# Patient Record
Sex: Male | Born: 1960
Health system: Southern US, Community
[De-identification: ages and names within clinical notes are randomized; demographics above are authoritative.]

## PROBLEM LIST (undated history)

## (undated) DIAGNOSIS — J309 Allergic rhinitis, unspecified: Secondary | ICD-10-CM

## (undated) DIAGNOSIS — M1990 Unspecified osteoarthritis, unspecified site: Secondary | ICD-10-CM

## (undated) DIAGNOSIS — N200 Calculus of kidney: Secondary | ICD-10-CM

## (undated) DIAGNOSIS — G8929 Other chronic pain: Secondary | ICD-10-CM

## (undated) DIAGNOSIS — K760 Fatty (change of) liver, not elsewhere classified: Secondary | ICD-10-CM

## (undated) DIAGNOSIS — Z91018 Allergy to other foods: Secondary | ICD-10-CM

## (undated) DIAGNOSIS — E7800 Pure hypercholesterolemia, unspecified: Secondary | ICD-10-CM

## (undated) HISTORY — DX: Other chronic pain: G89.29

## (undated) HISTORY — DX: Pure hypercholesterolemia, unspecified: E78.00

## (undated) HISTORY — DX: Allergic rhinitis, unspecified: J30.9

## (undated) HISTORY — DX: Fatty (change of) liver, not elsewhere classified: K76.0

## (undated) HISTORY — PX: TONSILLECTOMY AND ADENOIDECTOMY: SHX28

## (undated) HISTORY — DX: Calculus of kidney: N20.0

## (undated) HISTORY — DX: Allergy to other foods: Z91.018

---

## 2013-10-06 HISTORY — PX: SHOULDER SURGERY: SHX246

## 2015-10-07 HISTORY — PX: COLONOSCOPY: SHX174

## 2017-10-06 HISTORY — PX: COLONOSCOPY: SHX174

## 2018-02-02 ENCOUNTER — Encounter (HOSPITAL_COMMUNITY): Payer: Self-pay

## 2018-02-02 ENCOUNTER — Emergency Department (HOSPITAL_COMMUNITY)
Admission: EM | Admit: 2018-02-02 | Discharge: 2018-02-02 | Disposition: A | Discharge status: Home / Self Care | Payer: 59 | Attending: Emergency Medicine | Admitting: Emergency Medicine

## 2018-02-02 ENCOUNTER — Emergency Department (HOSPITAL_COMMUNITY): Payer: 59

## 2018-02-02 DIAGNOSIS — R109 Unspecified abdominal pain: Secondary | ICD-10-CM | POA: Diagnosis not present

## 2018-02-02 DIAGNOSIS — K5900 Constipation, unspecified: Secondary | ICD-10-CM | POA: Diagnosis not present

## 2018-02-02 DIAGNOSIS — N200 Calculus of kidney: Secondary | ICD-10-CM | POA: Diagnosis not present

## 2018-02-02 DIAGNOSIS — R1012 Left upper quadrant pain: Secondary | ICD-10-CM | POA: Diagnosis not present

## 2018-02-02 LAB — CBC WITH DIFFERENTIAL/PLATELET
BASOS ABS: 0 10*3/uL (ref 0.0–0.1)
Basophils Relative: 0 %
EOS ABS: 0.1 10*3/uL (ref 0.0–0.7)
Eosinophils Relative: 1 %
HEMATOCRIT: 46.2 % (ref 39.0–52.0)
HEMOGLOBIN: 15.3 g/dL (ref 13.0–17.0)
LYMPHS ABS: 1.7 10*3/uL (ref 0.7–4.0)
Lymphocytes Relative: 19 %
MCH: 29.1 pg (ref 26.0–34.0)
MCHC: 33.1 g/dL (ref 30.0–36.0)
MCV: 88 fL (ref 78.0–100.0)
MONOS PCT: 7 %
Monocytes Absolute: 0.6 10*3/uL (ref 0.1–1.0)
Neutro Abs: 6.9 10*3/uL (ref 1.7–7.7)
Neutrophils Relative %: 73 %
Platelets: 229 10*3/uL (ref 150–400)
RBC: 5.25 MIL/uL (ref 4.22–5.81)
RDW: 13.2 % (ref 11.5–15.5)
WBC: 9.3 10*3/uL (ref 4.0–10.5)

## 2018-02-02 LAB — URINALYSIS, ROUTINE W REFLEX MICROSCOPIC
Bilirubin Urine: NEGATIVE
GLUCOSE, UA: NEGATIVE mg/dL
Ketones, ur: NEGATIVE mg/dL
Leukocytes, UA: NEGATIVE
Nitrite: NEGATIVE
PH: 5 (ref 5.0–8.0)
Protein, ur: 100 mg/dL — AB
Specific Gravity, Urine: 1.03 (ref 1.005–1.030)

## 2018-02-02 LAB — COMPREHENSIVE METABOLIC PANEL
ALBUMIN: 4.5 g/dL (ref 3.5–5.0)
ALK PHOS: 49 U/L (ref 38–126)
ALT: 44 U/L (ref 17–63)
AST: 34 U/L (ref 15–41)
Anion gap: 10 (ref 5–15)
BILIRUBIN TOTAL: 1.2 mg/dL (ref 0.3–1.2)
BUN: 20 mg/dL (ref 6–20)
CALCIUM: 9.7 mg/dL (ref 8.9–10.3)
CO2: 23 mmol/L (ref 22–32)
CREATININE: 1.28 mg/dL — AB (ref 0.61–1.24)
Chloride: 106 mmol/L (ref 101–111)
GFR calc Af Amer: >60 mL/min (ref >60)
GLUCOSE: 121 mg/dL — AB (ref 65–99)
Potassium: 4.7 mmol/L (ref 3.5–5.1)
Sodium: 139 mmol/L (ref 135–145)
TOTAL PROTEIN: 7.6 g/dL (ref 6.5–8.1)

## 2018-02-02 LAB — LIPASE, BLOOD: LIPASE: 31 U/L (ref 11–51)

## 2018-02-02 MED ORDER — ONDANSETRON HCL 4 MG PO TABS: 4.0000 mg | ORAL_TABLET | Freq: Four times a day (QID) | ORAL | 0 refills | Status: DC | PRN

## 2018-02-02 MED ORDER — OXYCODONE-ACETAMINOPHEN 5-325 MG PO TABS: 1.0000 | ORAL_TABLET | Freq: Three times a day (TID) | ORAL | 0 refills | Status: DC | PRN

## 2018-02-02 MED ORDER — PROMETHAZINE HCL 25 MG/ML IJ SOLN
12.5000 mg | Freq: Once | INTRAMUSCULAR | Status: AC
  Administered 2018-02-02: 12.5 mg via INTRAVENOUS
  Filled 2018-02-02: qty 1

## 2018-02-02 MED ORDER — MORPHINE SULFATE (PF) 4 MG/ML IV SOLN
4.0000 mg | Freq: Once | INTRAVENOUS | Status: AC
  Administered 2018-02-02: 4 mg via INTRAVENOUS
  Filled 2018-02-02: qty 1

## 2018-02-02 MED ORDER — TAMSULOSIN HCL 0.4 MG PO CAPS: 0.4000 mg | ORAL_CAPSULE | Freq: Every day | ORAL | 0 refills | Status: DC

## 2018-02-02 MED ORDER — SODIUM CHLORIDE 0.9 % IV BOLUS
500.0000 mL | Freq: Once | INTRAVENOUS | Status: AC
Start: 2018-02-02 — End: 2018-02-02
  Administered 2018-02-02: 500 mL via INTRAVENOUS

## 2018-02-02 MED ORDER — KETOROLAC TROMETHAMINE 15 MG/ML IJ SOLN
15.0000 mg | Freq: Once | INTRAMUSCULAR | Status: AC
  Administered 2018-02-02: 15 mg via INTRAVENOUS
  Filled 2018-02-02: qty 1

## 2018-02-02 MED ORDER — ONDANSETRON HCL 4 MG/2ML IJ SOLN
4.0000 mg | Freq: Once | INTRAMUSCULAR | Status: AC
  Administered 2018-02-02: 4 mg via INTRAVENOUS
  Filled 2018-02-02: qty 2

## 2018-02-02 MED ORDER — ACETAMINOPHEN 325 MG PO TABS: 650.0000 mg | ORAL_TABLET | Freq: Once | ORAL | Status: DC

## 2018-02-02 MED ORDER — ONDANSETRON HCL 4 MG/2ML IJ SOLN
4.0000 mg | Freq: Once | INTRAMUSCULAR | Status: AC
Start: 2018-02-02 — End: 2018-02-02
  Administered 2018-02-02: 4 mg via INTRAVENOUS
  Filled 2018-02-02: qty 2

## 2018-02-02 MED ORDER — ACETAMINOPHEN 500 MG PO TABS: 500.0000 mg | ORAL_TABLET | Freq: Four times a day (QID) | ORAL | 0 refills | Status: DC | PRN

## 2018-02-02 NOTE — Discharge Instructions (Signed)
You will be given a prescription for Flomax, pain medication, and nausea medication.  You should not drive, work, or operate machinery while taking the pain medication as it can make you very drowsy.  You may take 500mg Tylenol every 6 hours and take one percocet every 8 hours for breakthrough pain. Make sure you stay well hydrated. You will need to follow-up with urology for reevaluation and for further treatment of your kidney stone.  You will need to return to the emergency department for any fevers, persistent pain, persistent vomiting, inability to urinate, or any new or worsening symptoms. ° °

## 2018-02-02 NOTE — ED Notes (Signed)
Report given to RN

## 2018-02-02 NOTE — ED Provider Notes (Signed)
[facility]-EMERGENCY DEPT Provider Note   CSN: [id number] Arrival date & time: 02/02/18  0532     History   Chief Complaint Chief Complaint  Patient presents with  . Flank Pain    HPI Earl Chambers is a 57 y.o. male.  HPI   Patient is a 57 year old male who presents the ED today complaining of left upper quadrant abdominal pain that radiates to the left lower quadrant that began this morning around 3 AM.  Pain woke him up from sleep suddenly.  It has been constant in nature and is severe.  He reports associated nausea and vomiting.  States he vomited 3 times.  No hematemesis.  Denies flank pain or fevers.  Reports that he has had intermittent issues with constipation over the last 2 weeks but his last bowel movement was yesterday and was normal without blood.  Denies any urinary frequency, dysuria, urgency.  Does note that his urine was somewhat darker than normal this morning.  History reviewed. No pertinent past medical history.  There are no active problems to display for this patient.   History reviewed. No pertinent surgical history.      Home Medications    Prior to Admission medications   Medication Sig Start Date End Date Taking? Authorizing Provider  acetaminophen (TYLENOL) 500 MG tablet Take 1 tablet (500 mg total) by mouth every 6 (six) hours as needed. 02/02/18   Andriana Casa S, PA-C  ondansetron (ZOFRAN) 4 MG tablet Take 1 tablet (4 mg total) by mouth every 6 (six) hours as needed for nausea or vomiting. 02/02/18   Khaled Herda S, PA-C  oxyCODONE-acetaminophen (PERCOCET/ROXICET) 5-325 MG tablet Take 1 tablet by mouth every 8 (eight) hours as needed for severe pain. 02/02/18   Jeneal Vogl S, PA-C  tamsulosin (FLOMAX) 0.4 MG CAPS capsule Take 1 capsule (0.4 mg total) by mouth daily. 02/02/18   Karena Kinker S, PA-C    Family History History reviewed. No pertinent family history.  Social History Social History   Tobacco Use  .  Smoking status: Never Smoker  . Smokeless tobacco: Never Used  Substance Use Topics  . Alcohol use: Never    Frequency: Never  . Drug use: Never     Allergies   Patient has no known allergies.   Review of Systems Review of Systems  Constitutional: Negative for chills and fever.  HENT: Negative for ear pain and sore throat.   Eyes: Negative for visual disturbance.  Respiratory: Negative for shortness of breath.   Cardiovascular: Negative for chest pain.  Gastrointestinal: Positive for abdominal pain, constipation, nausea and vomiting. Negative for blood in stool and diarrhea.  Genitourinary: Negative for dysuria, flank pain, frequency, hematuria, penile pain, penile swelling, scrotal swelling, testicular pain and urgency.       Dark urine  Musculoskeletal: Negative for back pain.  Skin: Negative for rash.  Neurological: Negative for dizziness, syncope, light-headedness and headaches.  All other systems reviewed and are negative.    Physical Exam Updated Vital Signs BP (!) 175/96 (BP Location: Left Arm)   Pulse 65   Temp (!) 97.4 F (36.3 C) (Oral)   Resp 18   SpO2 100%   Physical Exam  Constitutional: He appears well-developed and well-nourished.  Pt pacing the room, nontoxic appearing  HENT:  Head: Normocephalic and atraumatic.  Mouth/Throat: Oropharynx is clear and moist.  Eyes: Conjunctivae are normal. No scleral icterus.  Neck: Neck supple.  Cardiovascular: Normal rate, regular rhythm and normal  heart sounds.  No murmur heard. Pulmonary/Chest: Effort normal and breath sounds normal. No respiratory distress. He has no wheezes.  Abdominal: Soft. Bowel sounds are normal. He exhibits no distension. There is no tenderness. There is no guarding.  No CVA ttp  Musculoskeletal: He exhibits no edema.  Neurological: He is alert.  Skin: Skin is warm and dry. Capillary refill takes less than 2 seconds.  Psychiatric: He has a normal mood and affect.  Nursing note and  vitals reviewed.    ED Treatments / Results  Labs (all labs ordered are listed, but only abnormal results are displayed) Labs Reviewed  COMPREHENSIVE METABOLIC PANEL - Abnormal; Notable for the following components:      Result Value   Glucose, Bld 121 (*)    Creatinine, Ser 1.28 (*)    All other components within normal limits  URINALYSIS, ROUTINE W REFLEX MICROSCOPIC - Abnormal; Notable for the following components:   APPearance HAZY (*)    Hgb urine dipstick SMALL (*)    Protein, ur 100 (*)    Bacteria, UA RARE (*)    All other components within normal limits  CBC WITH DIFFERENTIAL/PLATELET  LIPASE, BLOOD    EKG None  Radiology Ct Renal Stone Study  Result Date: 02/02/2018 CLINICAL DATA:  Left flank pain EXAM: CT ABDOMEN AND PELVIS WITHOUT CONTRAST TECHNIQUE: Multidetector CT imaging of the abdomen and pelvis was performed following the standard protocol without IV contrast. COMPARISON:  None. FINDINGS: Lower chest: Lung bases are clear. No effusions. Heart is normal size. Hepatobiliary: Diffuse low-density throughout the liver compatible with fatty infiltration. No focal abnormality. Gallbladder unremarkable. Pancreas: No focal abnormality or ductal dilatation. Spleen: No focal abnormality.  Normal size. Adrenals/Urinary Tract: Moderate left hydronephrosis and perinephric stranding due to a punctate 2 mm left UVJ stone. No stones or hydronephrosis on the right. Adrenal glands and urinary bladder unremarkable. Stomach/Bowel: Normal appendix. Stomach, large and small bowel grossly unremarkable. Vascular/Lymphatic: Aortic and iliac calcifications scattered. No adenopathy. Reproductive: No visible focal abnormality. Other: No free fluid or free air. Bilateral inguinal hernias containing fat. Musculoskeletal: No acute bony abnormality. Degenerative facet disease in the lower lumbar spine. IMPRESSION: 2 mm left UVJ stone with moderate left hydronephrosis and perinephric stranding. Fatty  infiltration of the liver. Scattered aortoiliac atherosclerosis. Small bilateral inguinal hernias containing fat. Electronically Signed   By: Charlett Nose M.D.   On: 02/02/2018 09:54    Procedures Procedures (including critical care time)  Medications Ordered in ED Medications  ondansetron (ZOFRAN) injection 4 mg (4 mg Intravenous Given 02/02/18 0751)  sodium chloride 0.9 % bolus 500 mL (0 mLs Intravenous Stopped 02/02/18 0826)  morphine 4 MG/ML injection 4 mg (4 mg Intravenous Given 02/02/18 0820)  ondansetron (ZOFRAN) injection 4 mg (4 mg Intravenous Given 02/02/18 0820)  ketorolac (TORADOL) 15 MG/ML injection 15 mg (15 mg Intravenous Given 02/02/18 0847)  promethazine (PHENERGAN) injection 12.5 mg (12.5 mg Intravenous Given 02/02/18 1045)  morphine 4 MG/ML injection 4 mg (4 mg Intravenous Given 02/02/18 1045)     Initial Impression / Assessment and Plan / ED Course  I have reviewed the triage vital signs and the nursing notes.  Pertinent labs & imaging results that were available during my care of the patient were reviewed by me and considered in my medical decision making (see chart for details).   8:40 AM Re-evaluated pt. He is still c/o pain after morphine. Repeat abd exam with LUQ ttp. Will order toradol.  Reevaluated patient.  He  states he is feeling much better after Phenergan and another dose of morphine.  He has been able to tolerate p.o.  He has had no further episodes of vomiting.  Final Clinical Impressions(s) / ED Diagnoses   Final diagnoses:  Kidney stone   Pt has been diagnosed with a Kidney Stone via CT. There is no evidence of significant hydronephrosis, serum creatine WNL, vitals sign stable and the pt does not have irratractable vomiting. Pt will be dc home with pain medications, Zofran, Flomax.  He was given referral to urology.  He was advised to return to the ER immediately for any persistent vomiting, fevers, worsening abdominal pain, inability to urinate, or any new  or worsening symptoms.  All questions were answered and patient and wife at bedside understand plan and reasons to immediately to the ED.  ED Discharge Orders        Ordered    acetaminophen (TYLENOL) 500 MG tablet  Every 6 hours PRN     02/02/18 1212    oxyCODONE-acetaminophen (PERCOCET/ROXICET) 5-325 MG tablet  Every 8 hours PRN     02/02/18 1212    ondansetron (ZOFRAN) 4 MG tablet  Every 6 hours PRN     02/02/18 1212    tamsulosin (FLOMAX) 0.4 MG CAPS capsule  Daily     02/02/18 [address] [place], PA-C 02/02/18 1802    Shon Baton, MD 02/02/18 2259

## 2018-02-02 NOTE — ED Triage Notes (Signed)
Pt complains of left flank pain since 3am, pt doesn't have any urinary complaints

## 2018-03-29 ENCOUNTER — Encounter (HOSPITAL_COMMUNITY): Payer: Self-pay | Admitting: Emergency Medicine

## 2018-03-29 ENCOUNTER — Emergency Department (HOSPITAL_COMMUNITY)
Admission: EM | Admit: 2018-03-29 | Discharge: 2018-03-29 | Disposition: A | Discharge status: Home / Self Care | Payer: No Typology Code available for payment source | Attending: Emergency Medicine | Admitting: Emergency Medicine

## 2018-03-29 ENCOUNTER — Other Ambulatory Visit: Payer: Self-pay

## 2018-03-29 DIAGNOSIS — Z23 Encounter for immunization: Secondary | ICD-10-CM | POA: Diagnosis not present

## 2018-03-29 DIAGNOSIS — Y9389 Activity, other specified: Secondary | ICD-10-CM | POA: Insufficient documentation

## 2018-03-29 DIAGNOSIS — Y990 Civilian activity done for income or pay: Secondary | ICD-10-CM | POA: Diagnosis not present

## 2018-03-29 DIAGNOSIS — W01190A Fall on same level from slipping, tripping and stumbling with subsequent striking against furniture, initial encounter: Secondary | ICD-10-CM | POA: Diagnosis not present

## 2018-03-29 DIAGNOSIS — Y9289 Other specified places as the place of occurrence of the external cause: Secondary | ICD-10-CM | POA: Insufficient documentation

## 2018-03-29 DIAGNOSIS — S0101XA Laceration without foreign body of scalp, initial encounter: Secondary | ICD-10-CM | POA: Insufficient documentation

## 2018-03-29 DIAGNOSIS — S0102XA Laceration with foreign body of scalp, initial encounter: Secondary | ICD-10-CM | POA: Diagnosis not present

## 2018-03-29 MED ORDER — TETANUS-DIPHTH-ACELL PERTUSSIS 5-2.5-18.5 LF-MCG/0.5 IM SUSP
0.5000 mL | Freq: Once | INTRAMUSCULAR | Status: AC
  Administered 2018-03-29: 0.5 mL via INTRAMUSCULAR
  Filled 2018-03-29: qty 0.5

## 2018-03-29 MED ORDER — LIDOCAINE HCL 2 % IJ SOLN
20.0000 mL | Freq: Once | INTRAMUSCULAR | Status: AC
  Administered 2018-03-29: 400 mg
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
## 2018-03-29 NOTE — Discharge Instructions (Signed)
Please read attached information. If you experience any new or worsening signs or symptoms please return to the emergency room for evaluation. Please follow-up with your primary care provider or specialist as discussed.  °

## 2018-03-29 NOTE — ED Notes (Signed)
Pt verbalized understanding discharge instructions and denies any further needs or questions at this time. VS stable, ambulatory and steady gait.   
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
## 2018-08-19 DIAGNOSIS — R221 Localized swelling, mass and lump, neck: Secondary | ICD-10-CM | POA: Diagnosis not present

## 2018-08-30 ENCOUNTER — Ambulatory Visit (INDEPENDENT_AMBULATORY_CARE_PROVIDER_SITE_OTHER): Payer: 59 | Admitting: Family Medicine

## 2018-08-30 ENCOUNTER — Encounter: Payer: Self-pay | Admitting: Family Medicine

## 2018-08-30 VITALS — BP 120/71 | HR 69 | Temp 98.2°F | Resp 16 | Ht 68.0 in | Wt 194.0 lb

## 2018-08-30 DIAGNOSIS — E663 Overweight: Secondary | ICD-10-CM | POA: Diagnosis not present

## 2018-08-30 DIAGNOSIS — Z0000 Encounter for general adult medical examination without abnormal findings: Secondary | ICD-10-CM

## 2018-08-30 DIAGNOSIS — Z23 Encounter for immunization: Secondary | ICD-10-CM | POA: Diagnosis not present

## 2018-08-30 DIAGNOSIS — R5383 Other fatigue: Secondary | ICD-10-CM

## 2018-08-30 DIAGNOSIS — M542 Cervicalgia: Secondary | ICD-10-CM

## 2018-08-30 NOTE — Progress Notes (Signed)
Office Note 08/30/2018  CC:  Chief Complaint  Patient presents with  . Establish Care    Previous PCP: None  . Mass    back of neck    HPI:  Earl Chambers is a 57 y.o. male who is here to establish care. Patient's most recent primary MD: none Old records were not reviewed prior to or during today's visit.  Complains of fatigue: 2 months hx.  Has been on swing shifts x 2 yrs, but permanent night shifts the last 2 mo. Snores, but wife has not noted apneic spells in sleep.  Pt denies DOE, CP, dizziness, PND, orthopnea, cough, hemoptysis, or heat/cold intolerance.   No hx of fatigue in past other than this.  Denies sadness.  He says he is negative a lot, irritable, +anhedonia. Has short attention span more lately.  Can't sit down and focus on reading a book.   No problem with constant worrying. No melena.  No gross hematuria or BRBPR.  Appetite up and down. Gets 6 hours sleep after getting home in mornings.  He notes an egg shaped area on back of neck, was larger 2 wks ago, now has gone down today for the most part. No pain in the area.  No drainage.  Past Medical History:  Diagnosis Date  . Allergic rhinitis   . Food allergy    pecans    Past Surgical History:  Procedure Laterality Date  . COLONOSCOPY  2017   normal.  Recall 5 yrs?? per pt.  Marland Kitchen SHOULDER SURGERY Left 2015   RC repair    Family History  Problem Relation Age of Onset  . Cancer Sister   . Heart attack Brother     Social History   Socioeconomic History  . Marital status: Married    Spouse name: Not on file  . Number of children: Not on file  . Years of education: Not on file  . Highest education level: Not on file  Occupational History  . Not on file  Social Needs  . Financial resource strain: Not on file  . Food insecurity:    Worry: Not on file    Inability: Not on file  . Transportation needs:    Medical: Not on file    Non-medical: Not on file  Tobacco Use  . Smoking status: Never  Smoker  . Smokeless tobacco: Never Used  Substance and Sexual Activity  . Alcohol use: Yes    Frequency: Never    Comment: occ  . Drug use: Never  . Sexual activity: Not on file  Lifestyle  . Physical activity:    Days per week: Not on file    Minutes per session: Not on file  . Stress: Not on file  Relationships  . Social connections:    Talks on phone: Not on file    Gets together: Not on file    Attends religious service: Not on file    Active member of club or organization: Not on file    Attends meetings of clubs or organizations: Not on file    Relationship status: Not on file  . Intimate partner violence:    Fear of current or ex partner: Not on file    Emotionally abused: Not on file    Physically abused: Not on file    Forced sexual activity: Not on file  Other Topics Concern  . Not on file  Social History Narrative   Married, 1 daughter.   [place] but originally from [place], [place]   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   No tob.   Alc: occ beer.    Outpatient Encounter Medications as of 08/30/2018  Medication Sig  . Ibuprofen (ADVIL PO) Take by mouth as needed.  . [DISCONTINUED] acetaminophen (TYLENOL) 500 MG tablet Take 1 tablet (500 mg total) by mouth every 6 (six) hours as needed. (Patient not taking: Reported on 08/30/2018)  . [DISCONTINUED] ondansetron (ZOFRAN) 4 MG tablet Take 1 tablet (4 mg total) by mouth every 6 (six) hours as needed for nausea or vomiting. (Patient not taking: Reported on 08/30/2018)  . [DISCONTINUED] oxyCODONE-acetaminophen (PERCOCET/ROXICET) 5-325 MG tablet Take 1 tablet by mouth every 8 (eight) hours as needed for severe pain. (Patient not taking: Reported on 08/30/2018)  . [DISCONTINUED] tamsulosin (FLOMAX) 0.4 MG CAPS capsule Take 1 capsule (0.4 mg total) by mouth daily. (Patient not taking: Reported on 08/30/2018)   No facility-administered encounter  medications on file as of 08/30/2018.     No Known Allergies  ROS Review of Systems  Constitutional: Positive for fatigue. Negative for fever and unexpected weight change.  HENT: Negative for congestion, rhinorrhea and sore throat.   Eyes: Negative for visual disturbance.  Respiratory: Negative for cough and shortness of breath.   Cardiovascular: Negative for chest pain and palpitations.  Gastrointestinal: Negative for abdominal pain and nausea.  Endocrine: Negative for cold intolerance, heat intolerance, polydipsia, polyphagia and polyuria.  Genitourinary: Negative for dysuria.  Musculoskeletal: Negative for back pain and joint swelling.  Skin: Negative for rash.  Allergic/Immunologic: Positive for environmental allergies and food allergies.  Neurological: Negative for weakness and headaches.  Hematological: Negative for adenopathy.     PE; Blood pressure 120/71, pulse 69, temperature 98.2 F (36.8 C), temperature source Oral, resp. rate 16, height 5\' 8"  (1.727 m), weight 194 lb (88 kg), SpO2 98 %. Body mass index is 29.5 kg/m.  Gen: Alert, well appearing.  Patient is oriented to person, place, time, and situation. AFFECT: pleasant, lucid thought and speech. ZOX:WRUEENT:Eyes: no injection, icteris, swelling, or exudate.  EOMI, PERRLA. Mouth: lips without lesion/swelling.  Oral mucosa pink and moist. Oropharynx without erythema, exudate, or swelling.  CV: RRR, no m/r/g.   LUNGS: CTA bilat, nonlabored resps, good aeration in all lung fields. ABD: soft, NT, ND, BS normal.  No hepatospenomegaly or mass.  No bruits. EXT: no clubbing or cyanosis.  no edema.  Musculoskeletal: no joint swelling, erythema, warmth, or tenderness.  ROM of all joints intact. Neck: no nodule or mass, no muscle tightness, no rash, no tenderness.  Pertinent labs:  No results found for: TSH Lab Results  Component Value Date   WBC 9.3 02/02/2018   HGB 15.3 02/02/2018   HCT 46.2 02/02/2018   MCV 88.0 02/02/2018    PLT 229 02/02/2018   Lab Results  Component Value Date   CREATININE 1.28 (H) 02/02/2018   BUN 20 02/02/2018   NA 139 02/02/2018   K 4.7 02/02/2018   CL 106 02/02/2018   CO2 23 02/02/2018   Lab Results  Component Value Date   ALT 44 02/02/2018   AST 34 02/02/2018   ALKPHOS 49 02/02/2018   BILITOT 1.2 02/02/2018   No results found for: CHOL No results found for: HDL No results found for: LDLCALC No results found for: TRIG No results found for: CHOLHDL No results found for: PSA  No results found for: HGBA1C   ASSESSMENT AND PLAN:  New pt: get colonoscopy report to clarify findings and recommended f/u.  1) Fatigue, subacute: unclear etiology. No clear depression, no clear symptoms to suggest OSA. No sx's of CV/Pulm problem. Will check CBC w/diff, TSH, and CMET. If all normal, watchful waiting approach is the plan.  2) posterior neck swelling: suspect muscle tightness due to prolonged positioning in abnormal posture at work. No abnormality today on exam.  3) PReventative health care: flu vaccine today.  An After Visit Summary was printed and given to the patient.  Return for as needed.  Signed:  Santiago Bumpers, MD           08/30/2018

## 2018-08-31 LAB — CBC WITH DIFFERENTIAL/PLATELET
BASOS PCT: 0.9 % (ref 0.0–3.0)
Basophils Absolute: 0.1 10*3/uL (ref 0.0–0.1)
EOS ABS: 0.1 10*3/uL (ref 0.0–0.7)
Eosinophils Relative: 1.6 % (ref 0.0–5.0)
HEMATOCRIT: 46.9 % (ref 39.0–52.0)
HEMOGLOBIN: 15.6 g/dL (ref 13.0–17.0)
LYMPHS PCT: 32.8 % (ref 12.0–46.0)
Lymphs Abs: 2.1 10*3/uL (ref 0.7–4.0)
MCHC: 33.4 g/dL (ref 30.0–36.0)
MCV: 86.9 fl (ref 78.0–100.0)
MONOS PCT: 10.7 % (ref 3.0–12.0)
Monocytes Absolute: 0.7 10*3/uL (ref 0.1–1.0)
Neutro Abs: 3.4 10*3/uL (ref 1.4–7.7)
Neutrophils Relative %: 54 % (ref 43.0–77.0)
Platelets: 228 10*3/uL (ref 150.0–400.0)
RBC: 5.4 Mil/uL (ref 4.22–5.81)
RDW: 12.9 % (ref 11.5–15.5)
WBC: 6.3 10*3/uL (ref 4.0–10.5)

## 2018-08-31 LAB — COMPREHENSIVE METABOLIC PANEL
ALBUMIN: 4.5 g/dL (ref 3.5–5.2)
ALK PHOS: 41 U/L (ref 39–117)
ALT: 30 U/L (ref 0–53)
AST: 19 U/L (ref 0–37)
BUN: 15 mg/dL (ref 6–23)
CO2: 25 meq/L (ref 19–32)
Calcium: 10.2 mg/dL (ref 8.4–10.5)
Chloride: 108 mEq/L (ref 96–112)
Creatinine, Ser: 0.98 mg/dL (ref 0.40–1.50)
GFR: 84.26 mL/min (ref >60.00)
GLUCOSE: 67 mg/dL — AB (ref 70–99)
POTASSIUM: 4.6 meq/L (ref 3.5–5.1)
SODIUM: 140 meq/L (ref 135–145)
TOTAL PROTEIN: 6.9 g/dL (ref 6.0–8.3)
Total Bilirubin: 0.7 mg/dL (ref 0.2–1.2)

## 2018-08-31 LAB — TSH: TSH: 2.05 u[IU]/mL (ref 0.35–4.50)

## 2018-09-16 DIAGNOSIS — H524 Presbyopia: Secondary | ICD-10-CM | POA: Diagnosis not present

## 2019-07-15 IMAGING — CT CT RENAL STONE PROTOCOL
2 of 4 series · 17 of 46 positions shown, 19 images · non-contrast
Comparison: None.

CLINICAL DATA: Left flank pain

EXAM:
CT ABDOMEN AND PELVIS WITHOUT CONTRAST
TECHNIQUE: Multidetector CT imaging of the abdomen and pelvis was performed
following the standard protocol without IV contrast.

[Series 2: axial st · axial · 0.78mm/px · z∈[-540,-136]mm · 14 of 93 slices shown, 16 images]
[im 6/93  soft-tissue]
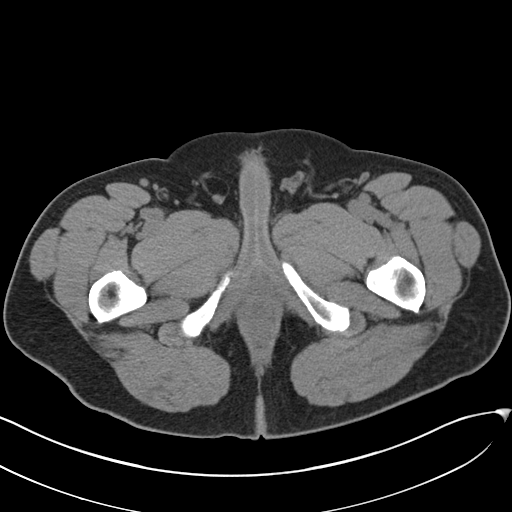
[im 6/93  bone]
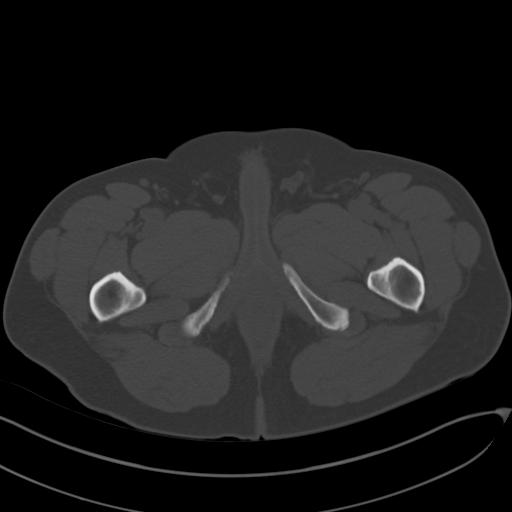
[im 12/93  soft-tissue]
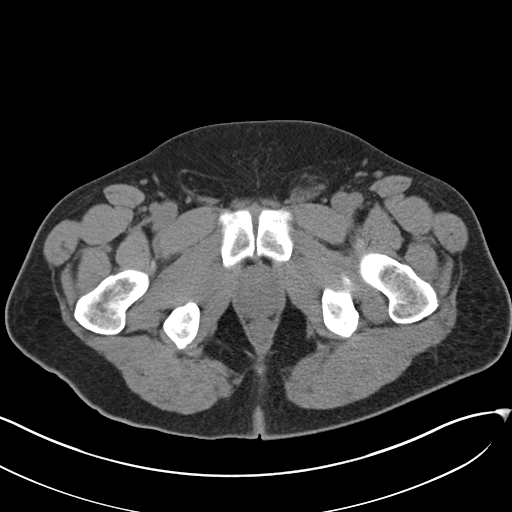
[im 18/93  soft-tissue]
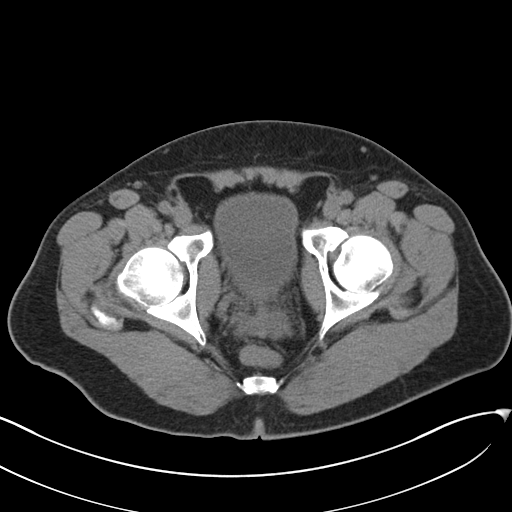
[im 24/93  soft-tissue]
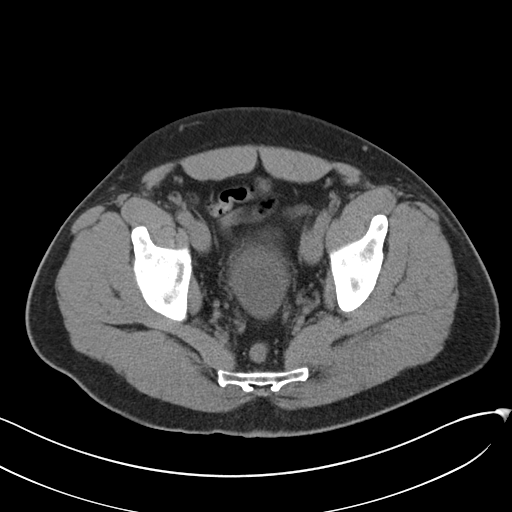
[im 29/93  soft-tissue]
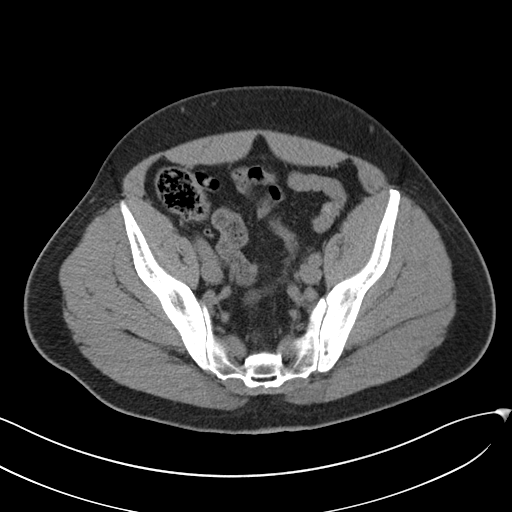
[im 35/93  soft-tissue]
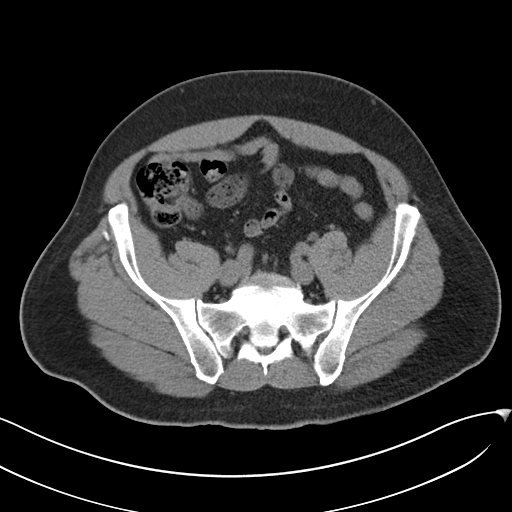
[im 41/93  soft-tissue]
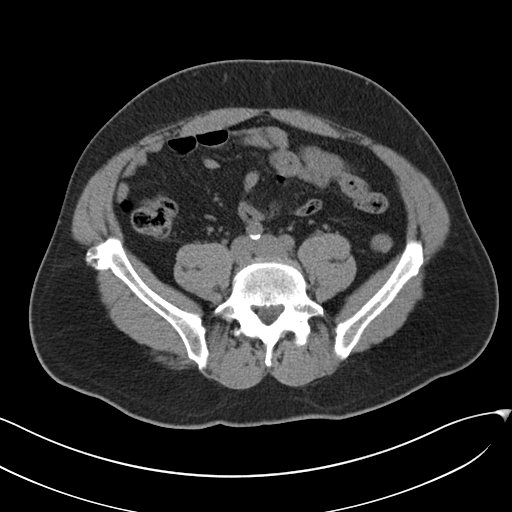
[im 52/93  soft-tissue]
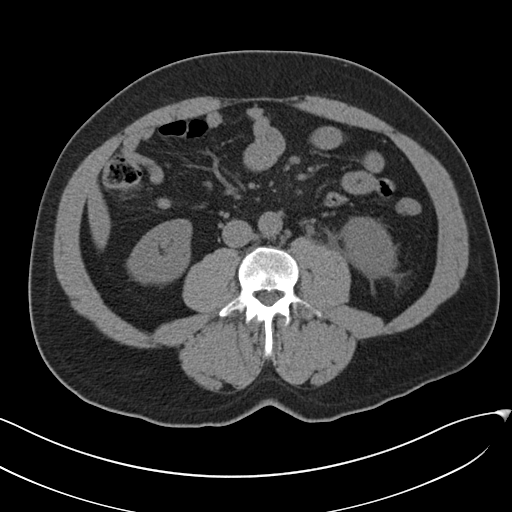
[im 58/93  soft-tissue]
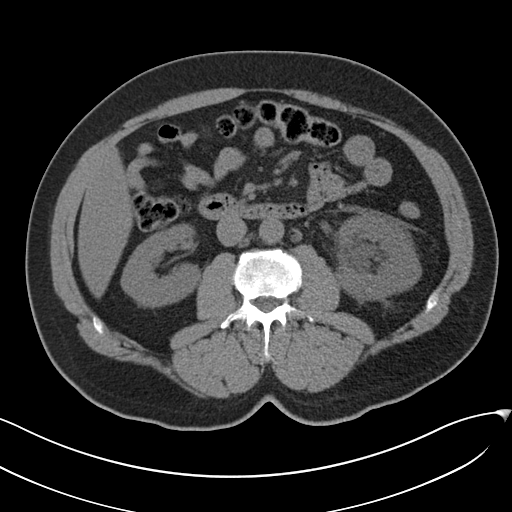
[im 58/93  bone]
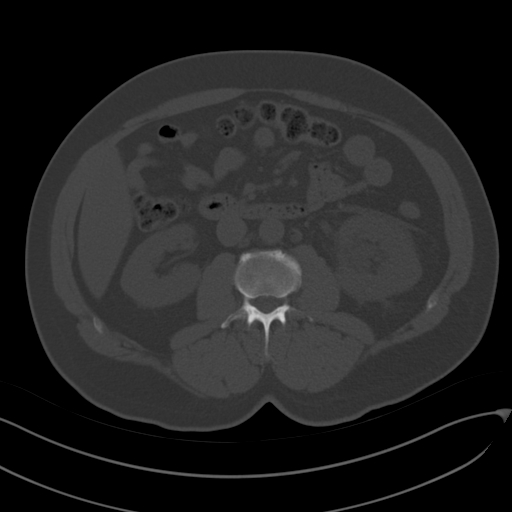
[im 64/93  soft-tissue]
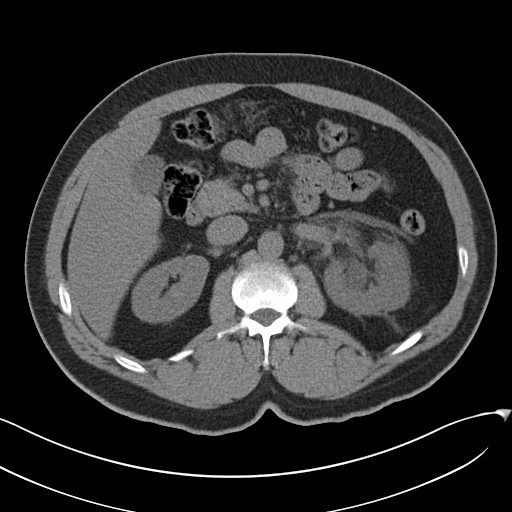
[im 70/93  soft-tissue]
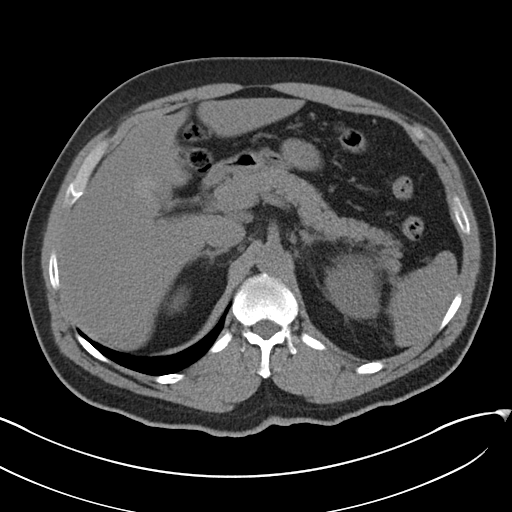
[im 75/93  soft-tissue]
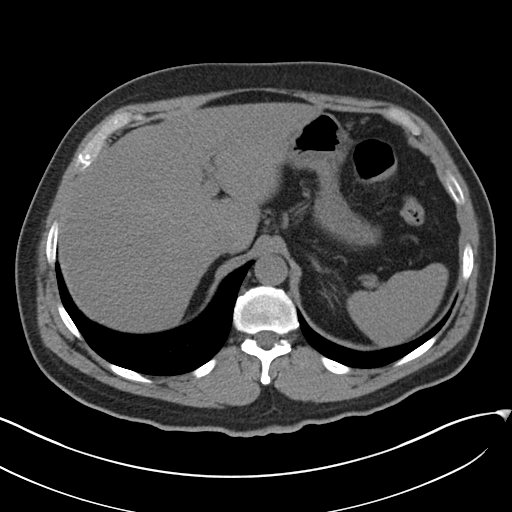
[im 81/93  soft-tissue]
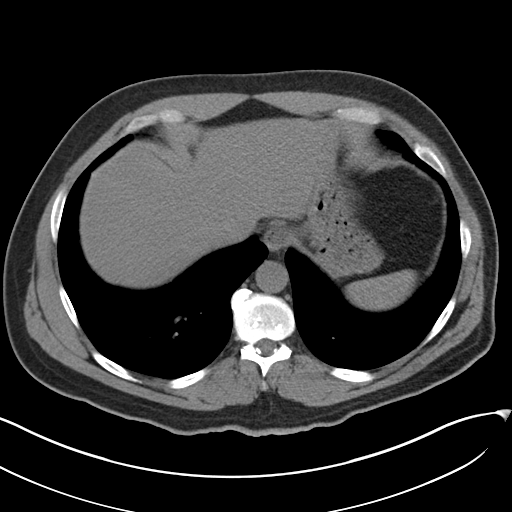
[im 87/93  soft-tissue]
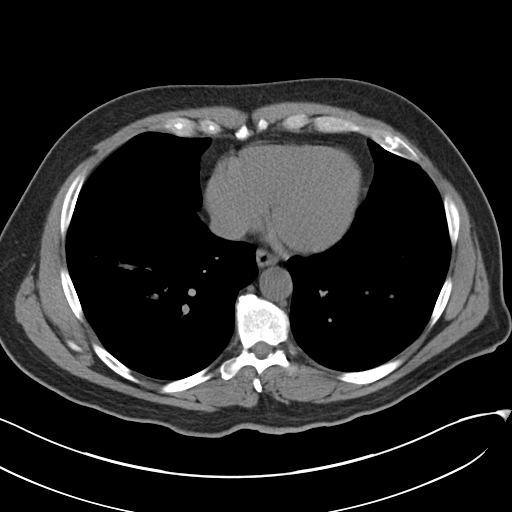

[Series 4: coronal · coronal · 0.80mm/px · 3 of 162 slices shown]
[im 54/162  soft-tissue]
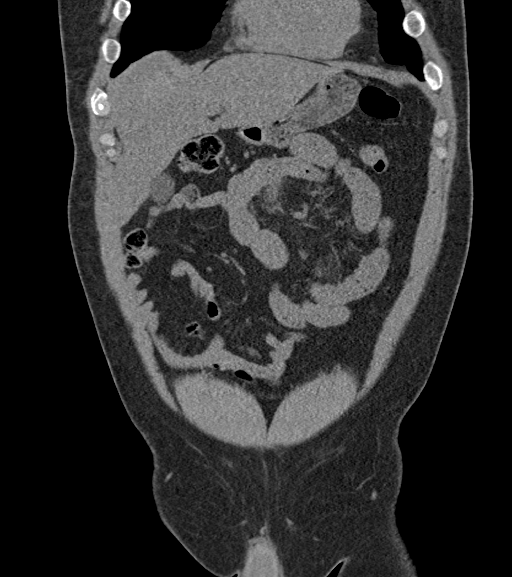
[im 72/162  soft-tissue]
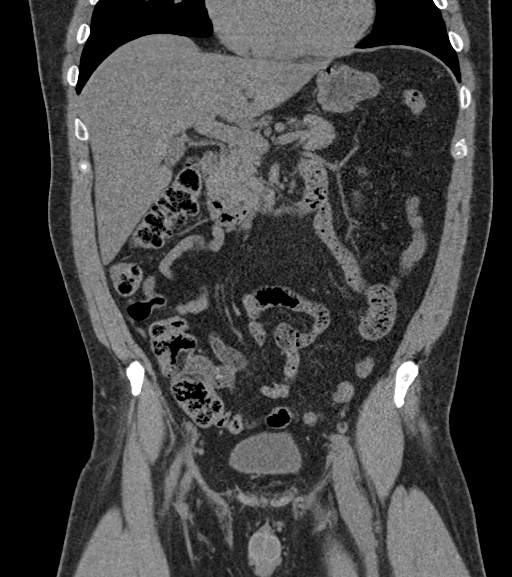
[im 90/162  soft-tissue]
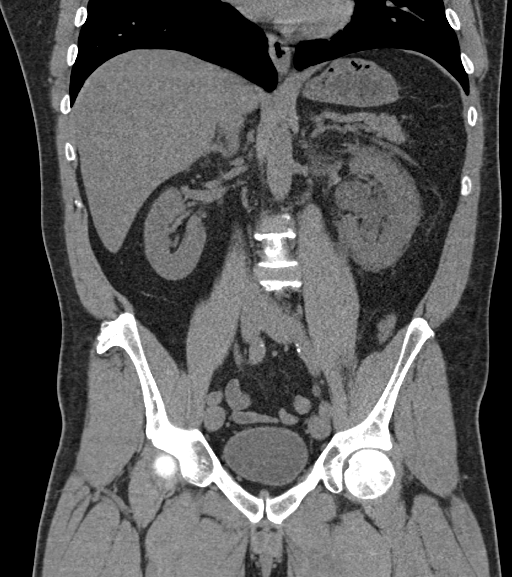

[17 of 46 positions shown; findings below may reference images not displayed]

FINDINGS: Lower chest: Lung bases are clear. No effusions. Heart is normal
size.

Hepatobiliary: Diffuse low-density throughout the liver compatible
with fatty infiltration. No focal abnormality. Gallbladder
unremarkable.

Pancreas: No focal abnormality or ductal dilatation.

Spleen: No focal abnormality.  Normal size.

Adrenals/Urinary Tract: Moderate left hydronephrosis and perinephric
stranding due to a punctate 2 mm left UVJ stone. No stones or
hydronephrosis on the right. Adrenal glands and urinary bladder
unremarkable.

Stomach/Bowel: Normal appendix. Stomach, large and small bowel
grossly unremarkable.

Vascular/Lymphatic: Aortic and iliac calcifications scattered. No
adenopathy.

Reproductive: No visible focal abnormality.

Other: No free fluid or free air. Bilateral inguinal hernias
containing fat.

Musculoskeletal: No acute bony abnormality. Degenerative facet
disease in the lower lumbar spine.
IMPRESSION: 2 mm left UVJ stone with moderate left hydronephrosis and
perinephric stranding.

Fatty infiltration of the liver.

Scattered aortoiliac atherosclerosis.

Small bilateral inguinal hernias containing fat.

## 2022-07-28 ENCOUNTER — Encounter: Payer: Self-pay | Admitting: Family Medicine

## 2022-07-31 ENCOUNTER — Ambulatory Visit: Payer: 59 | Admitting: Family Medicine

## 2022-07-31 ENCOUNTER — Encounter: Payer: Self-pay | Admitting: Family Medicine

## 2022-07-31 VITALS — BP 125/73 | HR 66 | Temp 98.7°F | Ht 68.0 in | Wt 193.4 lb

## 2022-07-31 DIAGNOSIS — Z125 Encounter for screening for malignant neoplasm of prostate: Secondary | ICD-10-CM | POA: Diagnosis not present

## 2022-07-31 DIAGNOSIS — M25511 Pain in right shoulder: Secondary | ICD-10-CM | POA: Diagnosis not present

## 2022-07-31 DIAGNOSIS — Z0000 Encounter for general adult medical examination without abnormal findings: Secondary | ICD-10-CM | POA: Diagnosis not present

## 2022-07-31 DIAGNOSIS — Z23 Encounter for immunization: Secondary | ICD-10-CM | POA: Diagnosis not present

## 2022-07-31 DIAGNOSIS — E7800 Pure hypercholesterolemia, unspecified: Secondary | ICD-10-CM

## 2022-07-31 DIAGNOSIS — Z3009 Encounter for other general counseling and advice on contraception: Secondary | ICD-10-CM | POA: Diagnosis not present

## 2022-07-31 DIAGNOSIS — G8929 Other chronic pain: Secondary | ICD-10-CM | POA: Diagnosis not present

## 2022-07-31 MED ORDER — IBUPROFEN 600 MG PO TABS: 600.0000 mg | ORAL_TABLET | Freq: Two times a day (BID) | ORAL | 1 refills | Status: DC | PRN

## 2022-07-31 MED ORDER — IBUPROFEN 800 MG PO TABS: 800.0000 mg | ORAL_TABLET | Freq: Two times a day (BID) | ORAL | 1 refills | Status: DC | PRN

## 2022-07-31 NOTE — Progress Notes (Signed)
Office Note 07/31/2022  CC:  Chief Complaint  Patient presents with   Establish Care   Shoulder Pain   Referral request    Pt would like referral for vasectomy.    HPI:  Earl Chambers is a 61 y.o. male who is here to reestablish care and get health maintenance exam. Patient's most recent primary MD: I saw him once back in April 2019. Old records were reviewed prior to or during today's visit.  #1 small lump on the scrotal area for the last few years.  About 1 to 2 cm in size per his estimate. Little uncomfortable but not painful.  Has not drained.  Has not turned red.  2.  At least 6 months of gradually progressive right shoulder pain, primarily with abduction and external rotation.  When trying to lift above his head hurts and he has to stop. No preceding injury/trauma. No neck pain. No arm paresthesias or weakness.  Past Medical History:  Diagnosis Date   Allergic rhinitis    Fatty liver    Noted on CT stone protocol 2019.   Food allergy    pecans   Nephrolithiasis    Left.  2019.    Past Surgical History:  Procedure Laterality Date   COLONOSCOPY  2019   normal.  Recall 10 yrs   SHOULDER SURGERY Left 2015   RC repair, distal clav excision   TONSILLECTOMY AND ADENOIDECTOMY      Family History  Problem Relation Age of Onset   Cancer Sister    Heart attack Brother     Social History   Socioeconomic History   Marital status: Married    Spouse name: Not on file   Number of children: Not on file   Years of education: Not on file   Highest education level: Not on file  Occupational History   Not on file  Tobacco Use   Smoking status: Never   Smokeless tobacco: Never  Vaping Use   Vaping Use: Never used  Substance and Sexual Activity   Alcohol use: Yes    Comment: occ   Drug use: Never   Sexual activity: Not on file  Other Topics Concern   Not on file  Social History Narrative   Married, 1 daughter.   Most recently relocated from [place],  [place] but originally from [place], [place].   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   No tob.   Alc: occ beer.   Social Determinants of Health   Financial Resource Strain: Not on file  Food Insecurity: Not on file  Transportation Needs: Not on file  Physical Activity: Not on file  Stress: Not on file  Social Connections: Not on file  Intimate Partner Violence: Not on file    Outpatient Encounter Medications as of 07/31/2022  Medication Sig   ibuprofen (ADVIL) 800 MG tablet Take 1 tablet (800 mg total) by mouth 2 (two) times daily as needed.   [DISCONTINUED] Ibuprofen (ADVIL PO) Take by mouth as needed.   [DISCONTINUED] ibuprofen (ADVIL) 600 MG tablet Take 1 tablet (600 mg total) by mouth 2 (two) times daily as needed for moderate pain.   fluorouracil (EFUDEX) 5 % cream Apply topically 2 (two) times daily. (Patient not taking: Reported on 07/31/2022)   triamcinolone cream (KENALOG) 0.1 % SMARTSIG:sparingly Topical Twice Daily (Patient not taking: Reported on 07/31/2022)   No facility-administered encounter medications on file as of 07/31/2022.    No Known Allergies  Review of  Systems  Constitutional:  Negative for appetite change, chills, fatigue and fever.  HENT:  Negative for congestion, dental problem, ear pain and sore throat.   Eyes:  Negative for discharge, redness and visual disturbance.  Respiratory:  Negative for cough, chest tightness, shortness of breath and wheezing.   Cardiovascular:  Negative for chest pain, palpitations and leg swelling.  Gastrointestinal:  Negative for abdominal pain, blood in stool, diarrhea, nausea and vomiting.  Genitourinary:  Negative for difficulty urinating, dysuria, flank pain, frequency, hematuria and urgency.  Musculoskeletal:  Positive for arthralgias (right shoulder). Negative for back pain, joint swelling, myalgias and neck stiffness.  Skin:  Negative for pallor and rash.  Neurological:  Negative for  dizziness, speech difficulty, weakness and headaches.  Hematological:  Negative for adenopathy. Does not bruise/bleed easily.  Psychiatric/Behavioral:  Negative for confusion and sleep disturbance. The patient is not nervous/anxious.     PE; Blood pressure 125/73, pulse 66, temperature 98.7 F (37.1 C), height $RemoveBe'5\' 8"'sJBrpFnZy$  (1.727 m), weight 193 lb 6.4 oz (87.7 kg), SpO2 95 %.Body mass index is 29.41 kg/m.   Physical Exam  Gen: Alert, well appearing.  Patient is oriented to person, place, time, and situation. AFFECT: pleasant, lucid thought and speech. ENT: Ears: EACs clear, normal epithelium.  TMs with good light reflex and landmarks bilaterally.  Eyes: no injection, icteris, swelling, or exudate.  EOMI, PERRLA. Nose: no drainage or turbinate edema/swelling.  No injection or focal lesion.  Mouth: lips without lesion/swelling.  Oral mucosa pink and moist.  Dentition intact and without obvious caries or gingival swelling.  Oropharynx without erythema, exudate, or swelling.  Neck: supple/nontender.  No LAD, mass, or TM.  Carotid pulses 2+ bilaterally, without bruits. CV: RRR, no m/r/g.   LUNGS: CTA bilat, nonlabored resps, good aeration in all lung fields. ABD: soft, NT, ND, BS normal.  No hepatospenomegaly or mass.  No bruits. EXT: no clubbing, cyanosis, or edema.  Skin - no sores or suspicious lesions or rashes or color changes Right shoulder: Tender to palpation over the [facility] joint.  Positive scarf sign. Positive Hawkins.  Neer's equivocal.  Speeds and Yergason's negative. Strength 5 out of 5 proximally and distally bilaterally. He did have some general subacromial discomfort with resisted external rotation and AB duction. Abduction limited to 100-110 deg  GU exam: Scrotum with approximately 2 cm oblong subcutaneous nodular lesion, firm and nontender.  No overlying erythema. No drainage tract.  Pertinent labs:  Lab Results  Component Value Date   TSH 2.05 08/30/2018   Lab Results   Component Value Date   WBC 6.3 08/30/2018   HGB 15.6 08/30/2018   HCT 46.9 08/30/2018   MCV 86.9 08/30/2018   PLT 228.0 08/30/2018   Lab Results  Component Value Date   CREATININE 0.98 08/30/2018   BUN 15 08/30/2018   NA 140 08/30/2018   K 4.6 08/30/2018   CL 108 08/30/2018   CO2 25 08/30/2018   Lab Results  Component Value Date   ALT 30 08/30/2018   AST 19 08/30/2018   ALKPHOS 41 08/30/2018   BILITOT 0.7 08/30/2018   ASSESSMENT AND PLAN:   New patient, reestablishing care.  #1 Health maintenance exam: Reviewed age and gender appropriate health maintenance issues (prudent diet, regular exercise, health risks of tobacco and excessive alcohol, use of seatbelts, fire alarms in home, use of sunscreen).  Also reviewed age and gender appropriate health screening as well as vaccine recommendations. Vaccines: Vaccine today.  He declined Shingrix--- wants to wait  until age 56.  Tdap up-to-date 2019. Labs: CBC, c-Met, and lipid panel ordered future when fasting. Prostate ca screening: PSA ordered Colon ca screening: Normal colonoscopy 2019.  Recall 10 years.  #2 subacute right shoulder pain. AC joint etiology suspected, question some RC impingement and tendinitis as well. Check right shoulder plain radiographs. Referral ordered for physical therapy. Ibuprofen 800 mg, 1 twice daily as needed. Discussed consideration of steroid injection if not significantly improving in the future.  #3 scrotal epidermal inclusion cyst. Reassured.  #4 vasectomy referral requested by patient today.  An After Visit Summary was printed and given to the patient.  Return in about 6 weeks (around 09/11/2022) for shoulder pain.  Signed:  Crissie Sickles, MD           07/31/2022

## 2022-07-31 NOTE — Patient Instructions (Signed)
Health Maintenance, Male Adopting a healthy lifestyle and getting preventive care are important in promoting health and wellness. Ask your health care provider about: The right schedule for you to have regular tests and exams. Things you can do on your own to prevent diseases and keep yourself healthy. What should I know about diet, weight, and exercise? Eat a healthy diet  Eat a diet that includes plenty of vegetables, fruits, low-fat dairy products, and lean protein. Do not eat a lot of foods that are high in solid fats, added sugars, or sodium. Maintain a healthy weight Body mass index (BMI) is a measurement that can be used to identify possible weight problems. It estimates body fat based on height and weight. Your health care provider can help determine your BMI and help you achieve or maintain a healthy weight. Get regular exercise Get regular exercise. This is one of the most important things you can do for your health. Most adults should: Exercise for at least 150 minutes each week. The exercise should increase your heart rate and make you sweat (moderate-intensity exercise). Do strengthening exercises at least twice a week. This is in addition to the moderate-intensity exercise. Spend less time sitting. Even light physical activity can be beneficial. Watch cholesterol and blood lipids Have your blood tested for lipids and cholesterol at 59 years of age, then have this test every 5 years. You may need to have your cholesterol levels checked more often if: Your lipid or cholesterol levels are high. You are older than 59 years of age. You are at high risk for heart disease. What should I know about cancer screening? Many types of cancers can be detected early and may often be prevented. Depending on your health history and family history, you may need to have cancer screening at various ages. This may include screening for: Colorectal cancer. Prostate cancer. Skin cancer. Lung  cancer. What should I know about heart disease, diabetes, and high blood pressure? Blood pressure and heart disease High blood pressure causes heart disease and increases the risk of stroke. This is more likely to develop in people who have high blood pressure readings or are overweight. Talk with your health care provider about your target blood pressure readings. Have your blood pressure checked: Every 3-5 years if you are 18-39 years of age. Every year if you are 40 years old or older. If you are between the ages of 65 and 75 and are a current or former smoker, ask your health care provider if you should have a one-time screening for abdominal aortic aneurysm (AAA). Diabetes Have regular diabetes screenings. This checks your fasting blood sugar level. Have the screening done: Once every three years after age 45 if you are at a normal weight and have a low risk for diabetes. More often and at a younger age if you are overweight or have a high risk for diabetes. What should I know about preventing infection? Hepatitis B If you have a higher risk for hepatitis B, you should be screened for this virus. Talk with your health care provider to find out if you are at risk for hepatitis B infection. Hepatitis C Blood testing is recommended for: Everyone born from 1945 through 1965. Anyone with known risk factors for hepatitis C. Sexually transmitted infections (STIs) You should be screened each year for STIs, including gonorrhea and chlamydia, if: You are sexually active and are younger than 59 years of age. You are older than 59 years of age and your   health care provider tells you that you are at risk for this type of infection. Your sexual activity has changed since you were last screened, and you are at increased risk for chlamydia or gonorrhea. Ask your health care provider if you are at risk. Ask your health care provider about whether you are at high risk for HIV. Your health care provider  may recommend a prescription medicine to help prevent HIV infection. If you choose to take medicine to prevent HIV, you should first get tested for HIV. You should then be tested every 3 months for as long as you are taking the medicine. Follow these instructions at home: Alcohol use Do not drink alcohol if your health care provider tells you not to drink. If you drink alcohol: Limit how much you have to 0-2 drinks a day. Know how much alcohol is in your drink. In the [place] one drink equals one 12 oz bottle of beer (355 mL), one 5 oz glass of wine (148 mL), or one 1 oz glass of hard liquor (44 mL). Lifestyle Do not use any products that contain nicotine or tobacco. These products include cigarettes, chewing tobacco, and vaping devices, such as e-cigarettes. If you need help quitting, ask your health care provider. Do not use street drugs. Do not share needles. Ask your health care provider for help if you need support or information about quitting drugs. General instructions Schedule regular health, dental, and eye exams. Stay current with your vaccines. Tell your health care provider if: You often feel depressed. You have ever been abused or do not feel safe at home. Summary Adopting a healthy lifestyle and getting preventive care are important in promoting health and wellness. Follow your health care provider's instructions about healthy diet, exercising, and getting tested or screened for diseases. Follow your health care provider's instructions on monitoring your cholesterol and blood pressure. This information is not intended to replace advice given to you by your health care provider. Make sure you discuss any questions you have with your health care provider. Document Revised: 02/11/2021 Document Reviewed: 02/11/2021 Elsevier Patient Education  2023 Elsevier Inc.  

## 2022-08-01 ENCOUNTER — Other Ambulatory Visit (INDEPENDENT_AMBULATORY_CARE_PROVIDER_SITE_OTHER): Payer: 59

## 2022-08-01 DIAGNOSIS — Z0000 Encounter for general adult medical examination without abnormal findings: Secondary | ICD-10-CM

## 2022-08-01 DIAGNOSIS — Z125 Encounter for screening for malignant neoplasm of prostate: Secondary | ICD-10-CM | POA: Diagnosis not present

## 2022-08-01 DIAGNOSIS — E7800 Pure hypercholesterolemia, unspecified: Secondary | ICD-10-CM

## 2022-08-01 LAB — CBC
HCT: 45.5 % (ref 39.0–52.0)
Hemoglobin: 14.7 g/dL (ref 13.0–17.0)
MCHC: 32.4 g/dL (ref 30.0–36.0)
MCV: 88.4 fl (ref 78.0–100.0)
Platelets: 189 10*3/uL (ref 150.0–400.0)
RBC: 5.15 Mil/uL (ref 4.22–5.81)
RDW: 13.5 % (ref 11.5–15.5)
WBC: 5.8 10*3/uL (ref 4.0–10.5)

## 2022-08-01 LAB — LIPID PANEL
Cholesterol: 169 mg/dL (ref 0–200)
HDL: 42.3 mg/dL (ref >39.00)
LDL Cholesterol: 104 mg/dL — ABNORMAL HIGH (ref 0–99)
NonHDL: 126.9
Total CHOL/HDL Ratio: 4
Triglycerides: 117 mg/dL (ref 0.0–149.0)
VLDL: 23.4 mg/dL (ref 0.0–40.0)

## 2022-08-01 LAB — COMPREHENSIVE METABOLIC PANEL
ALT: 22 U/L (ref 0–53)
AST: 19 U/L (ref 0–37)
Albumin: 4.2 g/dL (ref 3.5–5.2)
Alkaline Phosphatase: 42 U/L (ref 39–117)
BUN: 18 mg/dL (ref 6–23)
CO2: 29 mEq/L (ref 19–32)
Calcium: 9.5 mg/dL (ref 8.4–10.5)
Chloride: 107 mEq/L (ref 96–112)
Creatinine, Ser: 1.02 mg/dL (ref 0.40–1.50)
GFR: 80.53 mL/min (ref >60.00)
Glucose, Bld: 94 mg/dL (ref 70–99)
Potassium: 4.6 mEq/L (ref 3.5–5.1)
Sodium: 142 mEq/L (ref 135–145)
Total Bilirubin: 0.9 mg/dL (ref 0.2–1.2)
Total Protein: 6.4 g/dL (ref 6.0–8.3)

## 2022-08-01 LAB — PSA: PSA: 1.58 ng/mL (ref 0.10–4.00)

## 2022-08-08 ENCOUNTER — Telehealth: Payer: Self-pay

## 2022-08-08 NOTE — Telephone Encounter (Signed)
Signed and put in box to go up front. Signed:  Phil Puneet Masoner, MD           08/08/2022  

## 2022-08-08 NOTE — Telephone Encounter (Signed)
Received PT eval form. Placed on PCP desk for signature

## 2022-08-12 ENCOUNTER — Telehealth: Payer: Self-pay

## 2022-08-12 NOTE — Telephone Encounter (Signed)
A user error has taken place: encounter opened in error, closed for administrative reasons.

## 2022-09-10 ENCOUNTER — Ambulatory Visit: Payer: 59 | Admitting: Family Medicine

## 2022-09-11 ENCOUNTER — Ambulatory Visit: Payer: 59 | Admitting: Urology

## 2022-09-22 ENCOUNTER — Ambulatory Visit: Payer: 59 | Admitting: Family Medicine

## 2022-09-22 NOTE — Progress Notes (Deleted)
OFFICE VISIT  09/22/2022  CC: No chief complaint on file.  Patient is a 61 y.o. male who presents for 6-week follow-up right shoulder pain. A/P as of last visit: "subacute right shoulder pain. AC joint etiology suspected, question some RC impingement and tendinitis as well. Check right shoulder plain radiographs. Referral ordered for physical therapy. Ibuprofen 800 mg, 1 twice daily as needed. Discussed consideration of steroid injection if not significantly improving in the future."  INTERIM HX: All labs were normal last visit. It appears he did not get the x-ray ordered last visit. ***   Past Medical History:  Diagnosis Date   Allergic rhinitis    Fatty liver    Noted on CT stone protocol 2019.   Food allergy    pecans   Nephrolithiasis    Left.  2019.    Past Surgical History:  Procedure Laterality Date   COLONOSCOPY  2019   normal.  Recall 10 yrs   SHOULDER SURGERY Left 2015   RC repair, distal clav excision   TONSILLECTOMY AND ADENOIDECTOMY      Outpatient Medications Prior to Visit  Medication Sig Dispense Refill   fluorouracil (EFUDEX) 5 % cream Apply topically 2 (two) times daily. (Patient not taking: Reported on 07/31/2022)     ibuprofen (ADVIL) 800 MG tablet Take 1 tablet (800 mg total) by mouth 2 (two) times daily as needed. 60 tablet 1   triamcinolone cream (KENALOG) 0.1 % SMARTSIG:sparingly Topical Twice Daily (Patient not taking: Reported on 07/31/2022)     No facility-administered medications prior to visit.    No Known Allergies  ROS As per HPI  PE:    07/31/2022    1:19 PM 08/30/2018    2:34 PM 03/29/2018    7:30 PM  Vitals with BMI  Height 5\' 8"  5\' 8"    Weight 193 lbs 6 oz 194 lbs   BMI 29.41 29.5   Systolic 125 120  Diastolic 73 71 78  Pulse 66 69 66     Physical Exam  ***  LABS:  Last CBC Lab Results  Component Value Date   WBC 5.8 08/01/2022   HGB 14.7 08/01/2022   HCT 45.5 08/01/2022   MCV 88.4 08/01/2022    MCH 29.1 02/02/2018   RDW 13.5 08/01/2022   PLT 189.0 08/01/2022   Last metabolic panel Lab Results  Component Value Date   GLUCOSE 94 08/01/2022   NA 142 08/01/2022   K 4.6 08/01/2022   CL 107 08/01/2022   CO2 29 08/01/2022   BUN 18 08/01/2022   CREATININE 1.02 08/01/2022   GFRNONAA >60 02/02/2018   CALCIUM 9.5 08/01/2022   PROT 6.4 08/01/2022   ALBUMIN 4.2 08/01/2022   BILITOT 0.9 08/01/2022   ALKPHOS 42 08/01/2022   AST 19 08/01/2022   ALT 22 08/01/2022   ANIONGAP 10 02/02/2018   IMPRESSION AND PLAN:  No problem-specific Assessment & Plan notes found for this encounter.   An After Visit Summary was printed and given to the patient.  FOLLOW UP: No follow-ups on file.  Signed:  08/03/2022, MD           09/22/2022

## 2022-10-01 ENCOUNTER — Encounter: Payer: Self-pay | Admitting: Family Medicine

## 2022-10-08 ENCOUNTER — Ambulatory Visit: Payer: 59 | Admitting: Family Medicine

## 2022-10-08 ENCOUNTER — Encounter: Payer: Self-pay | Admitting: Family Medicine

## 2022-10-08 VITALS — BP 131/86 | HR 68 | Temp 98.5°F | Ht 68.0 in | Wt 200.8 lb

## 2022-10-08 DIAGNOSIS — M25511 Pain in right shoulder: Secondary | ICD-10-CM | POA: Diagnosis not present

## 2022-10-08 NOTE — Progress Notes (Signed)
OFFICE VISIT  10/08/2022  CC:  Chief Complaint  Patient presents with   Follow-up    Pt present to follow up on right shoulder pain. Pt states shoulder feels much better.    Patient is a 62 y.o. male who presents for follow-up right shoulder pain. I last saw him 07/31/2022. A/P as of that visit: "Subacute right shoulder pain. AC joint etiology suspected, question some RC impingement and tendinitis as well. Check right shoulder plain radiographs. Referral ordered for physical therapy. Ibuprofen 800 mg, 1 twice daily as needed. Discussed consideration of steroid injection if not significantly improving in the future."  INTERIM HX: Tavin did PT and he feels much much better. Not requiring any medicine for pain for his shoulder. He continues to do home exercises. He decided not to get the x-ray that I ordered last visit.  Since I last saw him he says he got COVID infection and then he got a sinus infection.  He has been on antibiotics for 10 days, finished 2 days ago (amoxil or augmentin--he does not recall which).  He says his left sided sinus pressure and nasal congestion and abnormal smell all got significantly better through the antibiotics course.  However the antibiotics did bother his stomach.  All of his symptoms returned this morning but have improved this afternoon with a dose of Mucinex. No fever.  He has some PND cough.  No wheezing or shortness of breath.  No sore throat.  He has not been exercising much due to feeling bad.  Says he has not been eating very well.  He has put on 10 pounds and is kind of frustrated.   Past Medical History:  Diagnosis Date   Allergic rhinitis    Fatty liver    Noted on CT stone protocol 2019.   Food allergy    pecans   Nephrolithiasis    Left.  2019.    Past Surgical History:  Procedure Laterality Date   COLONOSCOPY  2019   normal.  Recall 10 yrs   SHOULDER SURGERY Left 2015   RC repair, distal clav excision   TONSILLECTOMY AND  ADENOIDECTOMY      Outpatient Medications Prior to Visit  Medication Sig Dispense Refill   ibuprofen (ADVIL) 800 MG tablet Take 1 tablet (800 mg total) by mouth 2 (two) times daily as needed. 60 tablet 1   fluorouracil (EFUDEX) 5 % cream Apply topically 2 (two) times daily. (Patient not taking: Reported on 07/31/2022)     triamcinolone cream (KENALOG) 0.1 % SMARTSIG:sparingly Topical Twice Daily (Patient not taking: Reported on 07/31/2022)     No facility-administered medications prior to visit.    No Known Allergies  Review of Systems As per HPI  PE:    10/08/2022    1:54 PM 07/31/2022    1:19 PM 08/30/2018    2:34 PM  Vitals with BMI  Height 5\' 8"  5\' 8"  5\' 8"   Weight 200 lbs 13 oz 193 lbs 6 oz 194 lbs  BMI 30.54 48.54 62.7  Systolic 035 009 381  Diastolic 86 73 71  Pulse 68 66 69     Physical Exam  Gen: Alert, well appearing.  Patient is oriented to person, place, time, and situation. AFFECT: pleasant, lucid thought and speech. Right shoulder without tenderness to palpation.  He has full range of motion without any pain. 5 out of 5 strength in right arm.  LABS:  Last CBC Lab Results  Component Value Date   WBC 5.8  08/01/2022   HGB 14.7 08/01/2022   HCT 45.5 08/01/2022   MCV 88.4 08/01/2022   MCH 29.1 02/02/2018   RDW 13.5 08/01/2022   PLT 189.0 50/12/7046   Last metabolic panel Lab Results  Component Value Date   GLUCOSE 94 08/01/2022   NA 142 08/01/2022   K 4.6 08/01/2022   CL 107 08/01/2022   CO2 29 08/01/2022   BUN 18 08/01/2022   CREATININE 1.02 08/01/2022   GFRNONAA >60 02/02/2018   CALCIUM 9.5 08/01/2022   PROT 6.4 08/01/2022   ALBUMIN 4.2 08/01/2022   BILITOT 0.9 08/01/2022   ALKPHOS 42 08/01/2022   AST 19 08/01/2022   ALT 22 08/01/2022   ANIONGAP 10 02/02/2018   Lab Results  Component Value Date   CHOL 169 08/01/2022   HDL 42.30 08/01/2022   LDLCALC 104 (H) 08/01/2022   TRIG 117.0 08/01/2022   CHOLHDL 4 08/01/2022   Lab Results   Component Value Date   PSA 1.58 08/01/2022   IMPRESSION AND PLAN:  1) Right shoulder pain, essentially resolved with PT. Suspect he had a combo of impingement syndrome plus AC joint arthritis.  #2 acute sinusitis. Symptoms have returned after finishing antibiotics couple days ago. He had some GI upset on the antibiotics so he wanted to hold off on any further antibiotics at this time. He will call if things do not improve over the next week.  An After Visit Summary was printed and given to the patient.  FOLLOW UP: Return for 3-4 mo, pt desires a check up + labs as spring/summer approaches.  Signed:  Crissie Sickles, MD           10/08/2022

## 2023-01-04 ENCOUNTER — Other Ambulatory Visit: Payer: Self-pay | Admitting: Family Medicine

## 2023-04-24 ENCOUNTER — Other Ambulatory Visit: Payer: Self-pay | Admitting: Family Medicine

## 2023-06-30 ENCOUNTER — Other Ambulatory Visit: Payer: Self-pay | Admitting: Family Medicine

## 2023-07-17 ENCOUNTER — Encounter: Payer: Self-pay | Admitting: Urgent Care

## 2023-07-17 ENCOUNTER — Ambulatory Visit: Payer: 59

## 2023-07-17 ENCOUNTER — Ambulatory Visit: Payer: 59 | Admitting: Urgent Care

## 2023-07-17 VITALS — BP 124/80 | HR 57 | Wt 202.8 lb

## 2023-07-17 DIAGNOSIS — M25511 Pain in right shoulder: Secondary | ICD-10-CM | POA: Diagnosis not present

## 2023-07-17 DIAGNOSIS — G8929 Other chronic pain: Secondary | ICD-10-CM

## 2023-07-17 DIAGNOSIS — M24811 Other specific joint derangements of right shoulder, not elsewhere classified: Secondary | ICD-10-CM

## 2023-07-17 MED ORDER — CELECOXIB 200 MG PO CAPS: 200.0000 mg | ORAL_CAPSULE | Freq: Two times a day (BID) | ORAL | 3 refills | Status: DC

## 2023-07-17 NOTE — Progress Notes (Signed)
Established Patient Office Visit  Subjective:  Patient ID: Earl Chambers, male    DOB: 1960-11-23  Age: 62 y.o. MRN: [id number]  Chief Complaint  Patient presents with   Shoulder Pain    Right shoulder pain that's been going on for months.    62yo male presents today due to ongoing R shoulder pain. He was seen roughly one year ago for symptoms of the same. He was prescribed motrin 800mg  and referred to PT. Xray was also ordered, but this was never obtained. Pt states today that PT last year did not improve his symptoms and he takes ibuprofen on an as needed basis. 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 He reports over the past year, his pain level has been worsening and now he feels very weak in the R arm, worse with extension, abduction or any work above 90 degrees. His pain is located primarily in the joint of the shoulder, he denies any bony tenderness. No radiating pain to neck or back. He does reports intermittent paresthesias primarily affecting his 3rd and 4th digit, but states that this is bilateral. When he takes ibuprofen, it does help to some degree, but ROM is still negatively affected. He does have hx of L shoulder surgery and was found to have severe OA requiring partial removal of his lateral clavicle.   Shoulder Pain  The pain is present in the right shoulder. This is a chronic problem. The problem occurs constantly. The problem has been gradually worsening. Associated symptoms include a limited range of motion. He has tried NSAIDS for the symptoms. The treatment provided mild relief.    Past Medical History:  Diagnosis Date   Allergic rhinitis    Fatty liver    Noted on CT stone protocol 2019.   Food allergy    pecans   Nephrolithiasis    Left.  2019.   Past Surgical History:  Procedure Laterality Date   COLONOSCOPY  2019   normal.  Recall 10 yrs   SHOULDER SURGERY Left 2015   RC repair, distal clav excision   TONSILLECTOMY AND  ADENOIDECTOMY        ROS: as noted in HPI  Objective:     BP 124/80   Pulse (!) 57   Wt 202 lb 12.8 oz (92 kg)   SpO2 97%   BMI 30.84 kg/m    Physical Exam Vitals and nursing note reviewed.  Constitutional:      General: He is not in acute distress.    Appearance: Normal appearance. He is not ill-appearing, toxic-appearing or diaphoretic.  HENT:     Head: Normocephalic and atraumatic.  Cardiovascular:     Rate and Rhythm: Normal rate.     Pulses: Normal pulses.  Pulmonary:     Effort: Pulmonary effort is normal. No respiratory distress.  Musculoskeletal:     Right shoulder: Tenderness (to joint line) present. No swelling, deformity, effusion, laceration, bony tenderness or crepitus. Decreased range of motion (abduction, both internal and external rotation). Decreased strength. Normal pulse.     Right upper arm: Normal. No swelling, edema, deformity, lacerations, tenderness or bony tenderness.     Right elbow: Normal. No swelling or lacerations. Normal range of motion. No tenderness.     Right forearm: Normal. No swelling, tenderness or bony tenderness.     Right wrist: Normal.     Right hand: Normal.       Arms:     Comments: NEGATIVE  Juanetta Gosling, Neer, Yergason, sulcus sign POSITIVE O'Brien Supination of palm causes shoulder pain   Neurological:     Mental Status: He is alert.      No results found for any visits on 07/17/23.    The 10-year ASCVD risk score (Arnett DK, et al., 2019) is: 7.9%  Assessment & Plan:  Chronic right shoulder pain Assessment & Plan: Will re-order xray to rule out bony pathology, although this seems most consistent with a labral tear.  Pt to STOP ibuprofen PRN and will switch to celebrex 200mg  BID. Referral placed to sports med as I suspect an MR-arthrogram will be needed for further evaluation. SLAP tear rehab exercises provided for patient to try at home.   Orders: -     Celecoxib; Take 1 capsule (200 mg total) by mouth 2 (two)  times daily with a meal.  Dispense: 60 capsule; Refill: 3 -     Ambulatory referral to Sports Medicine -     DG Shoulder Right; Future  Internal derangement of right shoulder -     Celecoxib; Take 1 capsule (200 mg total) by mouth 2 (two) times daily with a meal.  Dispense: 60 capsule; Refill: 3 -     Ambulatory referral to Sports Medicine -     DG Shoulder Right; Future    Pt will follow up with sports medicine regarding his chronic R shoulder pain for further assessment and management.  Pt is due near the end of this month for his annual PE and fasting labs.  No follow-ups on file.   Maretta Bees, PA

## 2023-07-17 NOTE — Assessment & Plan Note (Signed)
Will re-order xray to rule out bony pathology, although this seems most consistent with a labral tear.  Pt to STOP ibuprofen PRN and will switch to celebrex 200mg  BID. Referral placed to sports med as I suspect an MR-arthrogram will be needed for further evaluation. SLAP tear rehab exercises provided for patient to try at home.

## 2023-07-17 NOTE — Patient Instructions (Addendum)
Please read the attached handout regarding SLAP lesions. I believe you may be suffering from a labral tear in your right shoulder.  Try and perform the rehab exercises at home if possible.  Stop taking ibuprofen, we will switch you to celebrex. Take this twice daily with meals, not just when you are in pain.  Please go and get the xray of your right shoulder today. I will call you with the results when available.  Please call the sports medicine physician listed on this form at your earliest convenience to schedule a further workup and evaluation.   Rodney Langton, MD [phone] [facility] [address] [place], [place] [address]  [phone]

## 2023-08-06 ENCOUNTER — Ambulatory Visit: Payer: 59 | Admitting: Sports Medicine

## 2023-08-06 ENCOUNTER — Encounter: Payer: Self-pay | Admitting: Sports Medicine

## 2023-08-06 DIAGNOSIS — G8929 Other chronic pain: Secondary | ICD-10-CM

## 2023-08-06 DIAGNOSIS — M25511 Pain in right shoulder: Secondary | ICD-10-CM

## 2023-08-06 NOTE — Assessment & Plan Note (Addendum)
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 For 6 months has had pain right shoulder localized over the deltoid and worse with abduction. He has tried some Celebrex which is a bit helpful. On exam he does have positive Neer's test with a bit of weakness, positive speeds test. Other examination maneuvers are for the most part normal. X-rays did show irregularities at the greater tuberosity consistent with rotator cuff tendinopathy. I suspect more of a rotator cuff tendinopathy type picture, he likely does have some rotator cuff tearing considering his above weakness. We discussed the anatomy and force coupling function of the rotator cuff. We will start conservatively, he will get some work restrictions, 25 pound lifting restriction, no overhead duty. Continue Celebrex. Adding formal physical therapy. Return in 6 weeks for reevaluation.

## 2023-08-06 NOTE — Progress Notes (Signed)
    Procedures performed today:    None.  Independent interpretation of notes and tests performed by another provider:   None.  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 For 6 months has had pain right shoulder localized over the deltoid and worse with abduction. He has tried some Celebrex which is a bit helpful. On exam he does have positive Neer's test with a bit of weakness, positive speeds test. Other examination maneuvers are for the most part normal. X-rays did show irregularities at the greater tuberosity consistent with rotator cuff tendinopathy. I suspect more of a rotator cuff tendinopathy type picture, he likely does have some rotator cuff tearing considering his above weakness. We discussed the anatomy and force coupling function of the rotator cuff. We will start conservatively, he will get some work restrictions, 25 pound lifting restriction, no overhead duty. Continue Celebrex. Adding formal physical therapy. Return in 6 weeks for reevaluation.    ____________________________________________ Ihor Austin. Benjamin Stain, M.D., ABFM., CAQSM., AME. Primary Care and Sports Medicine [facility]  Adjunct Professor of Family Medicine  [place] [employer]  Restaurant manager, fast food

## 2023-09-17 ENCOUNTER — Ambulatory Visit: Payer: 59 | Admitting: Sports Medicine

## 2023-10-06 ENCOUNTER — Emergency Department (HOSPITAL_BASED_OUTPATIENT_CLINIC_OR_DEPARTMENT_OTHER)
Admission: EM | Admit: 2023-10-06 | Discharge: 2023-10-06 | Disposition: A | Discharge status: Home / Self Care | Payer: 59 | Attending: Emergency Medicine | Admitting: Emergency Medicine

## 2023-10-06 ENCOUNTER — Other Ambulatory Visit: Payer: Self-pay

## 2023-10-06 ENCOUNTER — Encounter (HOSPITAL_BASED_OUTPATIENT_CLINIC_OR_DEPARTMENT_OTHER): Payer: Self-pay | Admitting: Emergency Medicine

## 2023-10-06 ENCOUNTER — Emergency Department (HOSPITAL_BASED_OUTPATIENT_CLINIC_OR_DEPARTMENT_OTHER): Payer: 59

## 2023-10-06 DIAGNOSIS — M545 Low back pain, unspecified: Secondary | ICD-10-CM | POA: Insufficient documentation

## 2023-10-06 DIAGNOSIS — R109 Unspecified abdominal pain: Secondary | ICD-10-CM | POA: Diagnosis not present

## 2023-10-06 HISTORY — DX: Unspecified osteoarthritis, unspecified site: M19.90

## 2023-10-06 LAB — COMPREHENSIVE METABOLIC PANEL
ALT: 32 U/L (ref 0–44)
AST: 24 U/L (ref 15–41)
Albumin: 4.5 g/dL (ref 3.5–5.0)
Alkaline Phosphatase: 48 U/L (ref 38–126)
Anion gap: 9 (ref 5–15)
BUN: 17 mg/dL (ref 6–20)
CO2: 26 mmol/L (ref 22–32)
Calcium: 9.4 mg/dL (ref 8.9–10.3)
Chloride: 108 mmol/L (ref 98–111)
Creatinine, Ser: 1.09 mg/dL (ref 0.61–1.24)
GFR, Estimated: >60 mL/min (ref >60)
Glucose, Bld: 99 mg/dL (ref 70–99)
Potassium: 4.3 mmol/L (ref 3.5–5.1)
Sodium: 143 mmol/L (ref 135–145)
Total Bilirubin: 0.6 mg/dL (ref 0.0–1.2)
Total Protein: 7.3 g/dL (ref 6.5–8.1)

## 2023-10-06 LAB — CBC
HCT: 47.4 % (ref 39.0–52.0)
Hemoglobin: 15.6 g/dL (ref 13.0–17.0)
MCH: 28.5 pg (ref 26.0–34.0)
MCHC: 32.9 g/dL (ref 30.0–36.0)
MCV: 86.7 fL (ref 80.0–100.0)
Platelets: 224 10*3/uL (ref 150–400)
RBC: 5.47 MIL/uL (ref 4.22–5.81)
RDW: 12.8 % (ref 11.5–15.5)
WBC: 8.6 10*3/uL (ref 4.0–10.5)
nRBC: 0 % (ref 0.0–0.2)

## 2023-10-06 LAB — URINALYSIS, ROUTINE W REFLEX MICROSCOPIC
Bilirubin Urine: NEGATIVE
Glucose, UA: NEGATIVE mg/dL
Hgb urine dipstick: NEGATIVE
Ketones, ur: NEGATIVE mg/dL
Leukocytes,Ua: NEGATIVE
Nitrite: NEGATIVE
Specific Gravity, Urine: 1.031 — ABNORMAL HIGH (ref 1.005–1.030)
pH: 5.5 (ref 5.0–8.0)

## 2023-10-06 LAB — LIPASE, BLOOD: Lipase: 17 U/L (ref 11–51)

## 2023-10-06 MED ORDER — HYDROCODONE-ACETAMINOPHEN 5-325 MG PO TABS: 1.0000 | ORAL_TABLET | Freq: Four times a day (QID) | ORAL | 0 refills | Status: DC | PRN

## 2023-10-06 MED ORDER — CYCLOBENZAPRINE HCL 10 MG PO TABS: 10.0000 mg | ORAL_TABLET | Freq: Two times a day (BID) | ORAL | 0 refills | Status: DC | PRN

## 2023-10-06 MED ORDER — OXYCODONE-ACETAMINOPHEN 5-325 MG PO TABS
1.0000 | ORAL_TABLET | ORAL | Status: DC | PRN
Start: 2023-10-06 — End: 2023-10-07
  Administered 2023-10-06: 1 via ORAL
  Filled 2023-10-06: qty 1

## 2023-10-06 NOTE — Discharge Instructions (Addendum)
 Schedule appointment to follow-up with sports medicine at [facility].  This will be convenient based on where you live.  Or you can follow back up with your orthopedic doctor.  Take the Flexeril  as directed.  Take the hydrocodone  as needed for severe pain.  When not taking the hydrocodone  would recommend taking extra strength Tylenol  2 tablets every 8.

## 2023-10-06 NOTE — ED Triage Notes (Signed)
Right side flank pain "Feels like when I had my kidney stone" Started at noon today

## 2023-10-06 NOTE — ED Provider Notes (Signed)
 [place] EMERGENCY DEPARTMENT AT [facility] Provider Note   CSN: [id number] Arrival date & time: 10/06/23  1246     History  Chief Complaint  Patient presents with   Abdominal Pain    Earl Chambers is a 62 y.o. male.  Patient with a complaint of lower back pain bilaterally no radiation of pain in the buttocks or into the legs no numbness or weakness to the legs.  Patient's had kidney stones in the past.  This is actually bothered him now for 2 nights.  He feels like the back is spasming.  Says it does not feel like when he had kidney stones before.  No nausea or vomiting with it.  Patient spent taking Celebrex .  Without much help he is taking that actually for a left shoulder problem.  Is followed by orthopedics.  Temp here is 98 blood pressure is 151/125 but repeat was 143/90 oxygen saturation has been 94% to 97%.  Past medical history significant for nephrolithiasis and arthritis.  Past surgical history sniffing for left shoulder surgery and 2015.  Patient works with the [employer].  So he is normally very active.  Patient is still having the low back pain.       Home Medications Prior to Admission medications   Medication Sig Start Date End Date Taking? Authorizing Provider  celecoxib  (CELEBREX ) 200 MG capsule Take 1 capsule (200 mg total) by mouth 2 (two) times daily with a meal. 07/17/23   Crain, Whitney L, PA      Allergies    Patient has no known allergies.    Review of Systems   Review of Systems  Constitutional:  Negative for chills and fever.  HENT:  Negative for ear pain and sore throat.   Eyes:  Negative for pain and visual disturbance.  Respiratory:  Negative for cough and shortness of breath.   Cardiovascular:  Negative for chest pain and palpitations.  Gastrointestinal:  Negative for abdominal pain and vomiting.  Genitourinary:  Negative for dysuria and hematuria.  Musculoskeletal:  Positive for back pain. Negative for arthralgias.   Skin:  Negative for color change and rash.  Neurological:  Negative for seizures, syncope, weakness and numbness.  All other systems reviewed and are negative.   Physical Exam Updated Vital Signs BP (!) 141/93   Pulse 62   Temp 98 F (36.7 C) (Oral)   Resp 17   SpO2 97%  Physical Exam Vitals and nursing note reviewed.  Constitutional:      General: He is not in acute distress.    Appearance: Normal appearance. He is well-developed.  HENT:     Head: Normocephalic and atraumatic.  Eyes:     Extraocular Movements: Extraocular movements intact.     Conjunctiva/sclera: Conjunctivae normal.     Pupils: Pupils are equal, round, and reactive to light.  Cardiovascular:     Rate and Rhythm: Normal rate and regular rhythm.     Heart sounds: No murmur heard. Pulmonary:     Effort: Pulmonary effort is normal. No respiratory distress.     Breath sounds: Normal breath sounds.  Abdominal:     Palpations: Abdomen is soft.     Tenderness: There is no abdominal tenderness.  Musculoskeletal:        General: No swelling or tenderness.     Cervical back: Normal range of motion and neck supple.     Comments: No tenderness to palpation to the lower back.  Skin:    General:  Skin is warm and dry.     Capillary Refill: Capillary refill takes less than 2 seconds.  Neurological:     General: No focal deficit present.     Mental Status: He is alert and oriented to person, place, and time.     Cranial Nerves: No cranial nerve deficit.     Sensory: No sensory deficit.     Motor: No weakness.  Psychiatric:        Mood and Affect: Mood normal.     ED Results / Procedures / Treatments   Labs (all labs ordered are listed, but only abnormal results are displayed) Labs Reviewed  URINALYSIS, ROUTINE W REFLEX MICROSCOPIC - Abnormal; Notable for the following components:      Result Value   Specific Gravity, Urine 1.031 (*)    Protein, ur TRACE (*)    All other components within normal limits   LIPASE, BLOOD  COMPREHENSIVE METABOLIC PANEL  CBC    EKG None  Radiology CT Renal Stone Study Result Date: 10/06/2023 CLINICAL DATA:  Right flank pain, history of nephrolithiasis EXAM: CT ABDOMEN AND PELVIS WITHOUT CONTRAST TECHNIQUE: Multidetector CT imaging of the abdomen and pelvis was performed following the standard protocol without IV contrast. RADIATION DOSE REDUCTION: This exam was performed according to the departmental dose-optimization program which includes automated exposure control, adjustment of the mA and/or kV according to patient size and/or use of iterative reconstruction technique. COMPARISON:  02/02/2018 CT abdomen/pelvis FINDINGS: Lower chest: No significant pulmonary nodules or acute consolidative airspace disease. Hepatobiliary: Diffuse hepatic steatosis. Normal liver size. No definite liver surface irregularity. No liver masses. Normal gallbladder with no radiopaque cholelithiasis. No biliary ductal dilatation. Pancreas: Normal, with no mass or duct dilation. Spleen: Normal size. No mass. Adrenals/Urinary Tract: Normal adrenals. No renal stones. No hydronephrosis. Small simple parapelvic renal cysts in both kidneys, for which no follow-up imaging is recommended. Normal caliber ureters. No ureteral stones. Normal bladder. Stomach/Bowel: Normal non-distended stomach. Normal caliber small bowel with no small bowel wall thickening. Normal appendix. Normal large bowel with no diverticulosis, large bowel wall thickening or pericolonic fat stranding. Vascular/Lymphatic: Atherosclerotic nonaneurysmal abdominal aorta. No pathologically enlarged lymph nodes in the abdomen or pelvis. Reproductive: Mild prostatomegaly. Other: No pneumoperitoneum, ascites or focal fluid collection. Musculoskeletal: No aggressive appearing focal osseous lesions. Mild thoracolumbar spondylosis. IMPRESSION: 1. No acute abnormality. No urolithiasis. No hydronephrosis. 2. Diffuse hepatic steatosis. 3. Mild  prostatomegaly. 4.  Aortic Atherosclerosis (ICD10-I70.0). Electronically Signed   By: Selinda DELENA Blue M.D.   On: 10/06/2023 20:33    Procedures Procedures    Medications Ordered in ED Medications  oxyCODONE -acetaminophen  (PERCOCET/ROXICET) 5-325 MG per tablet 1 tablet (1 tablet Oral Given 10/06/23 1337)    ED Course/ Medical Decision Making/ A&P                                 Medical Decision Making Amount and/or Complexity of Data Reviewed Labs: ordered.  Risk Prescription drug management.   Symptoms atypical for kidney stone.  Plus CT renal is negative.  And patient still having discomfort.  Talking about muscle spasm in that area particular during the night.  So this is most likely low back pain.  No evidence of any sciatica.  No complicating factors.  Complete metabolic panel electrolytes normal LFTs normal lipase is normal CBC white count is 8.6 hemoglobin is 15.6.  Urinalysis is negative.  CT renal stone study no acute abnormalities.  Patient really not relating any abdominal pain to me.  Is just been lower back pain.   Final Clinical Impression(s) / ED Diagnoses Final diagnoses:  Acute bilateral low back pain without sciatica    Rx / DC Orders ED Discharge Orders     None         Geraldene Hamilton, MD 10/06/23 [phone]

## 2024-03-15 ENCOUNTER — Encounter: Payer: Self-pay | Admitting: Family Medicine

## 2024-03-15 ENCOUNTER — Other Ambulatory Visit: Payer: Self-pay

## 2024-03-15 MED ORDER — IBUPROFEN 800 MG PO TABS: 800.0000 mg | ORAL_TABLET | Freq: Two times a day (BID) | ORAL | 1 refills | Status: AC | PRN

## 2024-03-15 NOTE — Telephone Encounter (Signed)
 Pt scheduled for 07/08 CPE

## 2024-03-15 NOTE — Telephone Encounter (Signed)
 RF request for ibuprofen  800 LOV:07/17/23 Next ov: n/a Last written: 06/30/23 (60, 1)  Pt states he would like to take ibuprofen  instead of celebrex . He can make an appt if needed but would like to get a refill. Please advise.

## 2024-03-15 NOTE — Telephone Encounter (Signed)
 Ibuprofen  800 mg, #60, refill x 1--> prescription sent. Encouraged him to follow-up for a physical exam and labs at his earliest convenience.

## 2024-04-12 ENCOUNTER — Encounter: Payer: Self-pay | Admitting: Family Medicine

## 2024-04-12 ENCOUNTER — Ambulatory Visit: Admitting: Family Medicine

## 2024-04-12 ENCOUNTER — Ambulatory Visit: Payer: Self-pay | Admitting: Family Medicine

## 2024-04-12 VITALS — BP 130/80 | HR 59 | Temp 97.8°F | Ht 68.0 in | Wt 200.6 lb

## 2024-04-12 DIAGNOSIS — Z23 Encounter for immunization: Secondary | ICD-10-CM

## 2024-04-12 DIAGNOSIS — Z13220 Encounter for screening for lipoid disorders: Secondary | ICD-10-CM

## 2024-04-12 DIAGNOSIS — E7800 Pure hypercholesterolemia, unspecified: Secondary | ICD-10-CM

## 2024-04-12 DIAGNOSIS — Z125 Encounter for screening for malignant neoplasm of prostate: Secondary | ICD-10-CM | POA: Diagnosis not present

## 2024-04-12 DIAGNOSIS — Z0000 Encounter for general adult medical examination without abnormal findings: Secondary | ICD-10-CM

## 2024-04-12 LAB — COMPREHENSIVE METABOLIC PANEL WITH GFR
ALT: 36 U/L (ref 0–53)
AST: 25 U/L (ref 0–37)
Albumin: 4.6 g/dL (ref 3.5–5.2)
Alkaline Phosphatase: 50 U/L (ref 39–117)
BUN: 17 mg/dL (ref 6–23)
CO2: 30 meq/L (ref 19–32)
Calcium: 9.8 mg/dL (ref 8.4–10.5)
Chloride: 105 meq/L (ref 96–112)
Creatinine, Ser: 1.03 mg/dL (ref 0.40–1.50)
GFR: 78.65 mL/min (ref >60.00)
Glucose, Bld: 96 mg/dL (ref 70–99)
Potassium: 4.7 meq/L (ref 3.5–5.1)
Sodium: 142 meq/L (ref 135–145)
Total Bilirubin: 0.6 mg/dL (ref 0.2–1.2)
Total Protein: 7.1 g/dL (ref 6.0–8.3)

## 2024-04-12 LAB — CBC WITH DIFFERENTIAL/PLATELET
Basophils Absolute: 0 K/uL (ref 0.0–0.1)
Basophils Relative: 0.8 % (ref 0.0–3.0)
Eosinophils Absolute: 0.3 K/uL (ref 0.0–0.7)
Eosinophils Relative: 5.1 % — ABNORMAL HIGH (ref 0.0–5.0)
HCT: 46.5 % (ref 39.0–52.0)
Hemoglobin: 15.3 g/dL (ref 13.0–17.0)
Lymphocytes Relative: 39.1 % (ref 12.0–46.0)
Lymphs Abs: 2.2 K/uL (ref 0.7–4.0)
MCHC: 32.9 g/dL (ref 30.0–36.0)
MCV: 87.4 fl (ref 78.0–100.0)
Monocytes Absolute: 0.5 K/uL (ref 0.1–1.0)
Monocytes Relative: 8.2 % (ref 3.0–12.0)
Neutro Abs: 2.7 K/uL (ref 1.4–7.7)
Neutrophils Relative %: 46.8 % (ref 43.0–77.0)
Platelets: 219 K/uL (ref 150.0–400.0)
RBC: 5.32 Mil/uL (ref 4.22–5.81)
RDW: 13.9 % (ref 11.5–15.5)
WBC: 5.7 K/uL (ref 4.0–10.5)

## 2024-04-12 LAB — PSA: PSA: 1.45 ng/mL (ref 0.10–4.00)

## 2024-04-12 LAB — LIPID PANEL
Cholesterol: 218 mg/dL — ABNORMAL HIGH (ref 0–200)
HDL: 48 mg/dL (ref >39.00)
LDL Cholesterol: 147 mg/dL — ABNORMAL HIGH (ref 0–99)
NonHDL: 170.37
Total CHOL/HDL Ratio: 5
Triglycerides: 118 mg/dL (ref 0.0–149.0)
VLDL: 23.6 mg/dL (ref 0.0–40.0)

## 2024-04-12 NOTE — Patient Instructions (Signed)
 Health Maintenance, Male  Adopting a healthy lifestyle and getting preventive care are important in promoting health and wellness. Ask your health care provider about:  The right schedule for you to have regular tests and exams.  Things you can do on your own to prevent diseases and keep yourself healthy.  What should I know about diet, weight, and exercise?  Eat a healthy diet    Eat a diet that includes plenty of vegetables, fruits, low-fat dairy products, and lean protein.  Do not eat a lot of foods that are high in solid fats, added sugars, or sodium.  Maintain a healthy weight  Body mass index (BMI) is a measurement that can be used to identify possible weight problems. It estimates body fat based on height and weight. Your health care provider can help determine your BMI and help you achieve or maintain a healthy weight.  Get regular exercise  Get regular exercise. This is one of the most important things you can do for your health. Most adults should:  Exercise for at least 150 minutes each week. The exercise should increase your heart rate and make you sweat (moderate-intensity exercise).  Do strengthening exercises at least twice a week. This is in addition to the moderate-intensity exercise.  Spend less time sitting. Even light physical activity can be beneficial.  Watch cholesterol and blood lipids  Have your blood tested for lipids and cholesterol at 61 years of age, then have this test every 5 years.  You may need to have your cholesterol levels checked more often if:  Your lipid or cholesterol levels are high.  You are older than 61 years of age.  You are at high risk for heart disease.  What should I know about cancer screening?  Many types of cancers can be detected early and may often be prevented. Depending on your health history and family history, you may need to have cancer screening at various ages. This may include screening for:  Colorectal cancer.  Prostate cancer.  Skin cancer.  Lung  cancer.  What should I know about heart disease, diabetes, and high blood pressure?  Blood pressure and heart disease  High blood pressure causes heart disease and increases the risk of stroke. This is more likely to develop in people who have high blood pressure readings or are overweight.  Talk with your health care provider about your target blood pressure readings.  Have your blood pressure checked:  Every 3-5 years if you are 9-95 years of age.  Every year if you are 85 years old or older.  If you are between the ages of 29 and 29 and are a current or former smoker, ask your health care provider if you should have a one-time screening for abdominal aortic aneurysm (AAA).  Diabetes  Have regular diabetes screenings. This checks your fasting blood sugar level. Have the screening done:  Once every three years after age 23 if you are at a normal weight and have a low risk for diabetes.  More often and at a younger age if you are overweight or have a high risk for diabetes.  What should I know about preventing infection?  Hepatitis B  If you have a higher risk for hepatitis B, you should be screened for this virus. Talk with your health care provider to find out if you are at risk for hepatitis B infection.  Hepatitis C  Blood testing is recommended for:  Everyone born from [birth year] through 1963.  Anyone  with known risk factors for hepatitis C.  Sexually transmitted infections (STIs)  You should be screened each year for STIs, including gonorrhea and chlamydia, if:  You are sexually active and are younger than 62 years of age.  You are older than 61 years of age and your health care provider tells you that you are at risk for this type of infection.  Your sexual activity has changed since you were last screened, and you are at increased risk for chlamydia or gonorrhea. Ask your health care provider if you are at risk.  Ask your health care provider about whether you are at high risk for HIV. Your health care provider  may recommend a prescription medicine to help prevent HIV infection. If you choose to take medicine to prevent HIV, you should first get tested for HIV. You should then be tested every 3 months for as long as you are taking the medicine.  Follow these instructions at home:  Alcohol use  Do not drink alcohol if your health care provider tells you not to drink.  If you drink alcohol:  Limit how much you have to 0-2 drinks a day.  Know how much alcohol is in your drink. In the [place] one drink equals one 12 oz bottle of beer (355 mL), one 5 oz glass of wine (148 mL), or one 1 oz glass of hard liquor (44 mL).  Lifestyle  Do not use any products that contain nicotine or tobacco. These products include cigarettes, chewing tobacco, and vaping devices, such as e-cigarettes. If you need help quitting, ask your health care provider.  Do not use street drugs.  Do not share needles.  Ask your health care provider for help if you need support or information about quitting drugs.  General instructions  Schedule regular health, dental, and eye exams.  Stay current with your vaccines.  Tell your health care provider if:  You often feel depressed.  You have ever been abused or do not feel safe at home.  Summary  Adopting a healthy lifestyle and getting preventive care are important in promoting health and wellness.  Follow your health care provider's instructions about healthy diet, exercising, and getting tested or screened for diseases.  Follow your health care provider's instructions on monitoring your cholesterol and blood pressure.  This information is not intended to replace advice given to you by your health care provider. Make sure you discuss any questions you have with your health care provider.  Document Revised: 02/11/2021 Document Reviewed: 02/11/2021  Elsevier Patient Education  2024 [employer].

## 2024-04-12 NOTE — Progress Notes (Signed)
 Office Note 04/12/2024  CC:  Chief Complaint  Patient presents with   Annual Exam    Pt is fasting    HPI:  Patient is a 63 y.o. male who is here for annual health maintenance exam. He is doing well other than chronic right shoulder pain.  He takes ibuprofen  every couple of days typically.  It helps but he does have to modify his activities quite a bit. He did some physical therapy but it did not seem to give any lasting improvement.  Past Medical History:  Diagnosis Date   Allergic rhinitis    Arthritis    Chronic right shoulder pain    RC tendonopathy (Dr. Pamella 2024)   Fatty liver    Noted on CT stone protocol 2019.   Food allergy    pecans   Nephrolithiasis    Left.  2019.    Past Surgical History:  Procedure Laterality Date   COLONOSCOPY  2019   normal.  Recall 10 yrs   SHOULDER SURGERY Left 2015   RC repair, distal clav excision   TONSILLECTOMY AND ADENOIDECTOMY      Family History  Problem Relation Age of Onset   Cancer Sister    Heart attack Brother     Social History   Socioeconomic History   Marital status: Married    Spouse name: Not on file   Number of children: Not on file   Years of education: Not on file   Highest education level: Not on file  Occupational History   Not on file  Tobacco Use   Smoking status: Never   Smokeless tobacco: Never  Vaping Use   Vaping status: Never Used  Substance and Sexual Activity   Alcohol use: Yes    Comment: occ   Drug use: Never   Sexual activity: Not on file  Other Topics Concern   Not on file  Social History Narrative   Married, 1 daughter.   Most recently relocated from [place], [place]  but originally from [place], [place].   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   No tob.   Alc: occ beer.   Social Drivers of Corporate investment banker Strain: Not on file  Food Insecurity: Not on file  Transportation Needs: Not on file  Physical Activity: Not on file   Stress: Not on file  Social Connections: Not on file  Intimate Partner Violence: Not on file    Outpatient Medications Prior to Visit  Medication Sig Dispense Refill   ibuprofen  (ADVIL ) 800 MG tablet Take 1 tablet (800 mg total) by mouth 2 (two) times daily as needed. 60 tablet 1   celecoxib  (CELEBREX ) 200 MG capsule Take 1 capsule (200 mg total) by mouth 2 (two) times daily with a meal. (Patient not taking: Reported on 04/12/2024) 60 capsule 3   cyclobenzaprine  (FLEXERIL ) 10 MG tablet Take 1 tablet (10 mg total) by mouth 2 (two) times daily as needed for muscle spasms. (Patient not taking: Reported on 04/12/2024) 20 tablet 0   HYDROcodone -acetaminophen  (NORCO/VICODIN) 5-325 MG tablet Take 1 tablet by mouth every 6 (six) hours as needed. (Patient not taking: Reported on 04/12/2024) 14 tablet 0   No facility-administered medications prior to visit.    No Known Allergies  Review of Systems  Constitutional:  Negative for appetite change, chills, fatigue and fever.  HENT:  Negative for congestion, dental problem, ear pain and sore throat.   Eyes:  Negative for discharge, redness and visual  disturbance.  Respiratory:  Negative for cough, chest tightness, shortness of breath and wheezing.   Cardiovascular:  Negative for chest pain, palpitations and leg swelling.  Gastrointestinal:  Negative for abdominal pain, blood in stool, diarrhea, nausea and vomiting.  Genitourinary:  Negative for difficulty urinating, dysuria, flank pain, frequency, hematuria and urgency.  Musculoskeletal:  Positive for arthralgias (R shoulder). Negative for back pain, joint swelling, myalgias and neck stiffness.  Skin:  Negative for pallor and rash.  Neurological:  Negative for dizziness, speech difficulty, weakness and headaches.  Hematological:  Negative for adenopathy. Does not bruise/bleed easily.  Psychiatric/Behavioral:  Negative for confusion and sleep disturbance. The patient is not nervous/anxious.     PE;     04/12/2024    8:27 AM 04/12/2024    8:20 AM 10/06/2023   10:35 PM  Vitals with BMI  Height  5' 8   Weight  200 lbs 10 oz   BMI  30.51   Systolic 130 151 858  Diastolic 80 81 93  Pulse  59 62    Gen: Alert, well appearing.  Patient is oriented to person, place, time, and situation. AFFECT: pleasant, lucid thought and speech. ENT: Ears: EACs clear, normal epithelium.  TMs with good light reflex and landmarks bilaterally.  Eyes: no injection, icteris, swelling, or exudate.  EOMI, PERRLA. Nose: no drainage or turbinate edema/swelling.  No injection or focal lesion.  Mouth: lips without lesion/swelling.  Oral mucosa pink and moist.  Dentition intact and without obvious caries or gingival swelling.  Oropharynx without erythema, exudate, or swelling.  Neck: supple/nontender.  No LAD, mass, or TM.  Carotid pulses 2+ bilaterally, without bruits. CV: RRR, no m/r/g.   LUNGS: CTA bilat, nonlabored resps, good aeration in all lung fields. ABD: soft, NT, ND, BS normal.  No hepatospenomegaly or mass.  No bruits. EXT: no clubbing, cyanosis, or edema.  Musculoskeletal: no joint swelling, erythema, warmth, or tenderness.  Right shoulder without tenderness.  He has positive O'Brien's.  Equivocal drop sign.  Significant pain with resisted abduction and external rotation.  Pertinent labs:  Lab Results  Component Value Date   TSH 2.05 08/30/2018   Lab Results  Component Value Date   WBC 8.6 10/06/2023   HGB 15.6 10/06/2023   HCT 47.4 10/06/2023   MCV 86.7 10/06/2023   PLT 224 10/06/2023   Lab Results  Component Value Date   CREATININE 1.09 10/06/2023   BUN 17 10/06/2023   NA 143 10/06/2023   K 4.3 10/06/2023   CL 108 10/06/2023   CO2 26 10/06/2023   Lab Results  Component Value Date   ALT 32 10/06/2023   AST 24 10/06/2023   ALKPHOS 48 10/06/2023   BILITOT 0.6 10/06/2023   Lab Results  Component Value Date   CHOL 169 08/01/2022   Lab Results  Component Value Date   HDL 42.30  08/01/2022   Lab Results  Component Value Date   LDLCALC 104 (H) 08/01/2022   Lab Results  Component Value Date   TRIG 117.0 08/01/2022   Lab Results  Component Value Date   CHOLHDL 4 08/01/2022   Lab Results  Component Value Date   PSA 1.58 08/01/2022   ASSESSMENT AND PLAN:   #1 Health maintenance exam: Reviewed age and gender appropriate health maintenance issues (prudent diet, regular exercise, health risks of tobacco and excessive alcohol, use of seatbelts, fire alarms in home, use of sunscreen).  Also reviewed age and gender appropriate health screening as well as vaccine  recommendations. Vaccines: Shingrix -->#1 today.  Prevnar 20-->#1 today.   Labs: CBC, c-Met, PSA, and lipid panel ordered. Prostate ca screening: PSA ordered Colon ca screening: Normal colonoscopy 2019.  Recall 10 years.  #2 chronic right shoulder pain.  Suspect rotator cuff tendinopathy. PT not very helpful. He is not really interested in steroid injection at this time. He is considering orthopedic referral but wants to get back to me on this.  An After Visit Summary was printed and given to the patient.  FOLLOW UP:  Return in about 1 year (around 04/12/2025) for annual CPE (fasting).  Signed:  Gerlene Hockey, MD           04/12/2024

## 2024-04-13 MED ORDER — ATORVASTATIN CALCIUM 20 MG PO TABS: 20.0000 mg | ORAL_TABLET | Freq: Every day | ORAL | 2 refills | Status: AC

## 2024-05-02 ENCOUNTER — Ambulatory Visit: Payer: Self-pay

## 2024-05-02 ENCOUNTER — Encounter: Payer: Self-pay | Admitting: Family Medicine

## 2024-05-02 ENCOUNTER — Ambulatory Visit: Admitting: Family Medicine

## 2024-05-02 VITALS — BP 132/82 | HR 63 | Temp 98.1°F | Wt 204.4 lb

## 2024-05-02 DIAGNOSIS — M25511 Pain in right shoulder: Secondary | ICD-10-CM

## 2024-05-02 MED ORDER — METHYLPREDNISOLONE 4 MG PO TBPK: ORAL_TABLET | ORAL | 0 refills | Status: AC

## 2024-05-02 NOTE — Telephone Encounter (Signed)
 FYI reviewed, pt scheduled

## 2024-05-02 NOTE — Telephone Encounter (Signed)
 FYI Only or Action Required?: FYI only for provider.  Patient was last seen in primary care on 04/12/2024 by McGowen, Aleene DEL, MD.  Called Nurse Triage reporting Shoulder Pain.  Symptoms began a week ago.  Interventions attempted: Prescription medications: ibuprofen .  Symptoms are: gradually worsening.  Triage Disposition: See Physician Within 24 Hours  Patient/caregiver understands and will follow disposition?: Yes                             Copied from CRM #[id number]. Topic: Clinical - Red Word Triage >> May 02, 2024  8:43 AM [place] E wrote: [facility] that prompted transfer to Nurse Triage: Right shoulder pain, been going on for years, but worsening over the past 2 days. Patient rated pain 7/8 out of 10. Would like ortho referral. Reason for Disposition  [1] Unable to use arm at all AND [2] because of shoulder pain or stiffness  Answer Assessment - Initial Assessment Questions 1. ONSET: When did the pain start?     Ongoing for years, worsening over past 2 days  2. LOCATION: Where is the pain located?     Right shoulder 3. PAIN: How bad is the pain? (Scale 1-10; or mild, moderate, severe)     States he cannot lift shoulder above his head without excruciating pain 4. WORK OR EXERCISE: Has there been any recent work or exercise that involved this part of the body?     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 CAUSE: What do you think is causing the shoulder pain?     States this has been an ongoing problem, states he had to have rotator cuff surgery on the other shoulder  6. OTHER SYMPTOMS: Do you have any other symptoms? (e.g., neck pain, swelling, rash, fever, numbness, weakness)     Ongoing weakness, denies swelling, denies numbness  Protocols used: Shoulder Pain-A-AH

## 2024-05-02 NOTE — Patient Instructions (Addendum)

## 2024-05-02 NOTE — Progress Notes (Signed)
 Earl Chambers , 28-Dec-1960, 63 y.o., male MRN: [id number] Patient Care Team    Relationship Specialty Notifications Start End  McGowen, Aleene DEL, MD PCP - General Family Medicine  07/31/22     Chief Complaint  Patient presents with   Shoulder Pain    A few days, R shoulder. Pt has taken 800mg  of Ibuprofen  today.      Subjective: Earl Chambers is a 63 y.o. Pt presents for an OV with complaints of worsening shoulder pain of 2 duration.  Associated symptoms include pain with any range of motion or lifting. He has a h/o chronic left shoulder pain- h/o left shoulder arthroscopy- distal clavicular excision.  H/o recurrent right shoulder pain over the last 1-2 years. He has an xray, performed PT and was evaluated by SM.   Shoulder x-ray 07/17/2023: X-rays did show irregularities at the greater tuberosity consistent with rotator cuff tendinopathy.  Pt has tried ibf to ease their symptoms.  He can ease the pain but is not helping with the weakness.  Patient reports this particular occasion he had just taken a routine walk.  He has been walking a couple miles daily.  Having overexerting, but he noticed his shoulder started bothering him shortly thereafter the walk.  He reports he has numbness and weakness present of his right arm.  He is unable to lifting a gallon of milk with his arm or push/pull. He states in between recurrences of visits over the last year and a half, he states the discomfort and pain become somewhat tolerable, but it has never completely resolved. Weakness has become more of an issue now.  Unable to hold himself up, such as climbing a ladder with his right arm. He points to the upper deltoid as location of pain.     05/02/2024   11:47 AM 04/12/2024    8:18 AM 07/17/2023    8:08 AM 07/31/2022    1:30 PM 08/30/2018    2:42 PM  Depression screen PHQ 2/9  Decreased Interest 3 3 3 3 3   Down, Depressed, Hopeless 2 0 2 1 1   PHQ - 2 Score 5 3 5 4 4   Altered sleeping  3 0 0  0  Tired, decreased energy 3 3 1  3   Change in appetite 3 0 0  0  Feeling bad or failure about yourself  0 0 1  1  Trouble concentrating 0 0 1  0  Moving slowly or fidgety/restless 0 0 0  0  Suicidal thoughts 0 0 0  0  PHQ-9 Score 14 6 8  8   Difficult doing work/chores Somewhat difficult Not difficult at all Somewhat difficult  Not difficult at all    No Known Allergies Social History   Social History Narrative   Married, 1 daughter.   Most recently relocated from [place], [place]  but originally from [place], [place].   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   No tob.   Alc: occ beer.   Past Medical History:  Diagnosis Date   Allergic rhinitis    Arthritis    Chronic right shoulder pain    RC tendonopathy (Dr. Pamella 2024)   Fatty liver    Noted on CT stone protocol 2019.   Food allergy    pecans   Hypercholesterolemia    2025 frmhm cv risk= 10%   Nephrolithiasis    Left.  2019.   Past Surgical History:  Procedure Laterality Date  COLONOSCOPY  2019   normal.  Recall 10 yrs   SHOULDER SURGERY Left 2015   RC repair, distal clav excision   TONSILLECTOMY AND ADENOIDECTOMY     Family History  Problem Relation Age of Onset   Cancer Sister    Heart attack Brother    Allergies as of 05/02/2024   No Known Allergies      Medication List        Accurate as of May 02, 2024 12:15 PM. If you have any questions, ask your nurse or doctor.          atorvastatin  20 MG tablet Commonly known as: LIPITOR Take 1 tablet (20 mg total) by mouth daily.   ibuprofen  800 MG tablet Commonly known as: ADVIL  Take 1 tablet (800 mg total) by mouth 2 (two) times daily as needed.   methylPREDNISolone  4 MG Tbpk tablet Commonly known as: MEDROL  DOSEPAK Per pack instruction Started by: Charlies Bellini        All past medical history, surgical history, allergies, family history, immunizations andmedications were updated in the EMR today  and reviewed under the history and medication portions of their EMR.     ROS Negative, with the exception of above mentioned in HPI   Objective:  BP 132/82   Pulse 63   Temp 98.1 F (36.7 C)   Wt 204 lb 6.4 oz (92.7 kg)   SpO2 97%   BMI 31.08 kg/m  Body mass index is 31.08 kg/m.  Physical Exam Vitals and nursing note reviewed. Exam conducted with a chaperone present.  Constitutional:      General: He is not in acute distress.    Appearance: Normal appearance. He is not ill-appearing, toxic-appearing or diaphoretic.  HENT:     Head: Normocephalic and atraumatic.  Eyes:     General: No scleral icterus.       Right eye: No discharge.        Left eye: No discharge.     Extraocular Movements: Extraocular movements intact.     Pupils: Pupils are equal, round, and reactive to light.  Musculoskeletal:     Right shoulder: Tenderness present. No swelling, deformity or bony tenderness. Decreased range of motion. Decreased strength.     Comments: Decrease Abduction, he is able raise arm above 90 degrees, but with significant pain.  positive empty can test, positive Hawkins. Both for rather significant weakness being present.  Neurovascularly intact distally.  Skin:    General: Skin is warm and dry.     Coloration: Skin is not jaundiced or pale.     Findings: No rash.  Neurological:     Mental Status: He is alert and oriented to person, place, and time. Mental status is at baseline.  Psychiatric:        Mood and Affect: Mood normal.        Behavior: Behavior normal.        Thought Content: Thought content normal.        Judgment: Judgment normal.      No results found. No results found. No results found for this or any previous visit (from the past 24 hours).  Assessment/Plan: Earl Chambers is a 63 y.o. male present for OV for  Recurrent acute pain of right shoulder (Primary) Suspect he is reinjuring the rotator tear.  He has rather significant weakness present on exam  today, as well as signs of rotator tendinopathy. Medrol  Dosepak prescribed Patient has been seen for recurrent right shoulder pain  4 times in the last 1.5 years.  He has performed physical therapy without notable improvement.  Discussed referral to orthopedics for further evaluation and he is agreeable to this approach today. - Ambulatory referral to Orthopedic Surgery  Reviewed expectations re: course of current medical issues. Discussed self-management of symptoms. Outlined signs and symptoms indicating need for more acute intervention. Patient verbalized understanding and all questions were answered. Patient received an After-Visit Summary.    Orders Placed This Encounter  Procedures   Ambulatory referral to Orthopedic Surgery   Meds ordered this encounter  Medications   methylPREDNISolone  (MEDROL  DOSEPAK) 4 MG TBPK tablet    Sig: Per pack instruction    Dispense:  21 each    Refill:  0   Referral Orders         Ambulatory referral to Orthopedic Surgery       Note is dictated utilizing voice recognition software. Although note has been proof read prior to signing, occasional typographical errors still can be missed. If any questions arise, please do not hesitate to call for verification.   electronically signed by:  Charlies Bellini, DO   Primary Care - [place]

## 2024-06-07 ENCOUNTER — Encounter: Payer: Self-pay | Admitting: Sports Medicine
